# Patient Record
Sex: Male | Born: 1970 | Race: Black or African American | Hispanic: No | Marital: Single | Smoking: Light tobacco smoker
Health system: Southern US, Community
[De-identification: ages and names within clinical notes are randomized; demographics above are authoritative.]

## PROBLEM LIST (undated history)

## (undated) DIAGNOSIS — I10 Essential (primary) hypertension: Secondary | ICD-10-CM

## (undated) HISTORY — DX: Essential (primary) hypertension: I10

---

## 2002-10-04 ENCOUNTER — Emergency Department (HOSPITAL_COMMUNITY): Admission: EM | Admit: 2002-10-04 | Discharge: 2002-10-04 | Payer: Self-pay | Admitting: *Deleted

## 2002-11-10 ENCOUNTER — Encounter: Payer: Self-pay | Admitting: Emergency Medicine

## 2002-11-10 ENCOUNTER — Emergency Department (HOSPITAL_COMMUNITY): Admission: EM | Admit: 2002-11-10 | Discharge: 2002-11-10 | Payer: Self-pay | Admitting: Emergency Medicine

## 2006-07-20 ENCOUNTER — Emergency Department (HOSPITAL_COMMUNITY): Admission: EM | Admit: 2006-07-20 | Discharge: 2006-07-20 | Payer: Self-pay | Admitting: Emergency Medicine

## 2007-06-30 ENCOUNTER — Emergency Department (HOSPITAL_COMMUNITY): Admission: EM | Admit: 2007-06-30 | Discharge: 2007-06-30 | Payer: Self-pay | Admitting: Emergency Medicine

## 2007-07-19 ENCOUNTER — Emergency Department (HOSPITAL_COMMUNITY): Admission: EM | Admit: 2007-07-19 | Discharge: 2007-07-19 | Payer: Self-pay | Admitting: Family Medicine

## 2011-03-06 ENCOUNTER — Emergency Department (HOSPITAL_COMMUNITY)
Admission: EM | Admit: 2011-03-06 | Discharge: 2011-03-06 | Disposition: A | Discharge status: Home / Self Care | Payer: No Typology Code available for payment source | Attending: Emergency Medicine | Admitting: Emergency Medicine

## 2011-03-06 DIAGNOSIS — M545 Low back pain, unspecified: Secondary | ICD-10-CM | POA: Insufficient documentation

## 2011-03-06 DIAGNOSIS — S335XXA Sprain of ligaments of lumbar spine, initial encounter: Secondary | ICD-10-CM | POA: Insufficient documentation

## 2011-03-06 DIAGNOSIS — M25519 Pain in unspecified shoulder: Secondary | ICD-10-CM | POA: Insufficient documentation

## 2020-06-26 ENCOUNTER — Encounter (HOSPITAL_COMMUNITY): Payer: Self-pay | Admitting: Emergency Medicine

## 2020-06-26 ENCOUNTER — Inpatient Hospital Stay (HOSPITAL_COMMUNITY)
Admission: EM | Admit: 2020-06-26 | Discharge: 2020-06-30 | DRG: 871 | Disposition: A | Discharge status: Home / Self Care | Payer: 59 | Attending: Internal Medicine | Admitting: Internal Medicine

## 2020-06-26 ENCOUNTER — Emergency Department (HOSPITAL_COMMUNITY): Payer: 59

## 2020-06-26 DIAGNOSIS — G8929 Other chronic pain: Secondary | ICD-10-CM | POA: Diagnosis present

## 2020-06-26 DIAGNOSIS — E785 Hyperlipidemia, unspecified: Secondary | ICD-10-CM | POA: Diagnosis present

## 2020-06-26 DIAGNOSIS — R9431 Abnormal electrocardiogram [ECG] [EKG]: Secondary | ICD-10-CM | POA: Diagnosis not present

## 2020-06-26 DIAGNOSIS — R609 Edema, unspecified: Secondary | ICD-10-CM | POA: Diagnosis not present

## 2020-06-26 DIAGNOSIS — F17200 Nicotine dependence, unspecified, uncomplicated: Secondary | ICD-10-CM | POA: Diagnosis present

## 2020-06-26 DIAGNOSIS — J90 Pleural effusion, not elsewhere classified: Secondary | ICD-10-CM | POA: Diagnosis not present

## 2020-06-26 DIAGNOSIS — I452 Bifascicular block: Secondary | ICD-10-CM | POA: Diagnosis present

## 2020-06-26 DIAGNOSIS — I10 Essential (primary) hypertension: Secondary | ICD-10-CM | POA: Diagnosis present

## 2020-06-26 DIAGNOSIS — Z6832 Body mass index (BMI) 32.0-32.9, adult: Secondary | ICD-10-CM | POA: Diagnosis not present

## 2020-06-26 DIAGNOSIS — A419 Sepsis, unspecified organism: Secondary | ICD-10-CM | POA: Diagnosis present

## 2020-06-26 DIAGNOSIS — K429 Umbilical hernia without obstruction or gangrene: Secondary | ICD-10-CM | POA: Diagnosis present

## 2020-06-26 DIAGNOSIS — R1031 Right lower quadrant pain: Secondary | ICD-10-CM

## 2020-06-26 DIAGNOSIS — R071 Chest pain on breathing: Secondary | ICD-10-CM | POA: Diagnosis not present

## 2020-06-26 DIAGNOSIS — Z720 Tobacco use: Secondary | ICD-10-CM | POA: Diagnosis not present

## 2020-06-26 DIAGNOSIS — R0902 Hypoxemia: Secondary | ICD-10-CM | POA: Diagnosis not present

## 2020-06-26 DIAGNOSIS — Z8616 Personal history of COVID-19: Secondary | ICD-10-CM

## 2020-06-26 DIAGNOSIS — F1410 Cocaine abuse, uncomplicated: Secondary | ICD-10-CM | POA: Diagnosis present

## 2020-06-26 DIAGNOSIS — K5900 Constipation, unspecified: Secondary | ICD-10-CM | POA: Diagnosis present

## 2020-06-26 DIAGNOSIS — J189 Pneumonia, unspecified organism: Secondary | ICD-10-CM | POA: Diagnosis present

## 2020-06-26 DIAGNOSIS — R6520 Severe sepsis without septic shock: Secondary | ICD-10-CM | POA: Diagnosis present

## 2020-06-26 DIAGNOSIS — R109 Unspecified abdominal pain: Secondary | ICD-10-CM | POA: Diagnosis not present

## 2020-06-26 DIAGNOSIS — I351 Nonrheumatic aortic (valve) insufficiency: Secondary | ICD-10-CM | POA: Diagnosis not present

## 2020-06-26 DIAGNOSIS — E669 Obesity, unspecified: Secondary | ICD-10-CM | POA: Diagnosis present

## 2020-06-26 DIAGNOSIS — R079 Chest pain, unspecified: Secondary | ICD-10-CM | POA: Diagnosis present

## 2020-06-26 DIAGNOSIS — Z20822 Contact with and (suspected) exposure to covid-19: Secondary | ICD-10-CM | POA: Diagnosis present

## 2020-06-26 DIAGNOSIS — D649 Anemia, unspecified: Secondary | ICD-10-CM | POA: Diagnosis present

## 2020-06-26 DIAGNOSIS — J9601 Acute respiratory failure with hypoxia: Secondary | ICD-10-CM | POA: Diagnosis present

## 2020-06-26 LAB — CBC WITH DIFFERENTIAL/PLATELET
Abs Immature Granulocytes: 0.06 10*3/uL (ref 0.00–0.07)
Basophils Absolute: 0.1 10*3/uL (ref 0.0–0.1)
Basophils Relative: 0 %
Eosinophils Absolute: 0 10*3/uL (ref 0.0–0.5)
Eosinophils Relative: 0 %
HCT: 42.6 % (ref 39.0–52.0)
Hemoglobin: 13.3 g/dL (ref 13.0–17.0)
Immature Granulocytes: 0 %
Lymphocytes Relative: 11 %
Lymphs Abs: 1.8 10*3/uL (ref 0.7–4.0)
MCH: 26.9 pg (ref 26.0–34.0)
MCHC: 31.2 g/dL (ref 30.0–36.0)
MCV: 86.2 fL (ref 80.0–100.0)
Monocytes Absolute: 0.9 10*3/uL (ref 0.1–1.0)
Monocytes Relative: 6 %
Neutro Abs: 13.2 10*3/uL — ABNORMAL HIGH (ref 1.7–7.7)
Neutrophils Relative %: 83 %
Platelets: 298 10*3/uL (ref 150–400)
RBC: 4.94 MIL/uL (ref 4.22–5.81)
RDW: 14.6 % (ref 11.5–15.5)
WBC: 16 10*3/uL — ABNORMAL HIGH (ref 4.0–10.5)
nRBC: 0 % (ref 0.0–0.2)

## 2020-06-26 LAB — CBC
HCT: 39.7 % (ref 39.0–52.0)
Hemoglobin: 13 g/dL (ref 13.0–17.0)
MCH: 28 pg (ref 26.0–34.0)
MCHC: 32.7 g/dL (ref 30.0–36.0)
MCV: 85.4 fL (ref 80.0–100.0)
Platelets: 295 10*3/uL (ref 150–400)
RBC: 4.65 MIL/uL (ref 4.22–5.81)
RDW: 14.6 % (ref 11.5–15.5)
WBC: 13.2 10*3/uL — ABNORMAL HIGH (ref 4.0–10.5)
nRBC: 0 % (ref 0.0–0.2)

## 2020-06-26 LAB — LACTIC ACID, PLASMA
Lactic Acid, Venous: 1.9 mmol/L (ref 0.5–1.9)
Lactic Acid, Venous: 1 mmol/L (ref 0.5–1.9)
Lactic Acid, Venous: 1 mmol/L (ref 0.5–1.9)

## 2020-06-26 LAB — COMPREHENSIVE METABOLIC PANEL
ALT: 58 U/L — ABNORMAL HIGH (ref 0–44)
AST: 20 U/L (ref 15–41)
Albumin: 3.5 g/dL (ref 3.5–5.0)
Alkaline Phosphatase: 91 U/L (ref 38–126)
Anion gap: 9 (ref 5–15)
BUN: 10 mg/dL (ref 6–20)
CO2: 25 mmol/L (ref 22–32)
Calcium: 9.2 mg/dL (ref 8.9–10.3)
Chloride: 103 mmol/L (ref 98–111)
Creatinine, Ser: 1.2 mg/dL (ref 0.61–1.24)
GFR, Estimated: >60 mL/min (ref >60)
Glucose, Bld: 123 mg/dL — ABNORMAL HIGH (ref 70–99)
Potassium: 3.8 mmol/L (ref 3.5–5.1)
Sodium: 137 mmol/L (ref 135–145)
Total Bilirubin: 1.4 mg/dL — ABNORMAL HIGH (ref 0.3–1.2)
Total Protein: 7.2 g/dL (ref 6.5–8.1)

## 2020-06-26 LAB — URINALYSIS, ROUTINE W REFLEX MICROSCOPIC
Bacteria, UA: NONE SEEN
Bilirubin Urine: NEGATIVE
Glucose, UA: NEGATIVE mg/dL
Ketones, ur: NEGATIVE mg/dL
Leukocytes,Ua: NEGATIVE
Nitrite: NEGATIVE
Protein, ur: NEGATIVE mg/dL
Specific Gravity, Urine: >1.046 — ABNORMAL HIGH (ref 1.005–1.030)
pH: 5 (ref 5.0–8.0)

## 2020-06-26 LAB — LACTATE DEHYDROGENASE: LDH: 137 U/L (ref 98–192)

## 2020-06-26 LAB — LIPASE, BLOOD: Lipase: 26 U/L (ref 11–51)

## 2020-06-26 LAB — C-REACTIVE PROTEIN: CRP: 23.1 mg/dL — ABNORMAL HIGH (ref <1.0)

## 2020-06-26 LAB — TROPONIN I (HIGH SENSITIVITY): Troponin I (High Sensitivity): 12 ng/L (ref <18)

## 2020-06-26 LAB — TRIGLYCERIDES: Triglycerides: 41 mg/dL (ref <150)

## 2020-06-26 LAB — FERRITIN: Ferritin: 292 ng/mL (ref 24–336)

## 2020-06-26 LAB — PROCALCITONIN: Procalcitonin: 0.15 ng/mL

## 2020-06-26 LAB — D-DIMER, QUANTITATIVE: D-Dimer, Quant: 1.5 ug/mL-FEU — ABNORMAL HIGH (ref 0.00–0.50)

## 2020-06-26 LAB — FIBRINOGEN: Fibrinogen: 775 mg/dL — ABNORMAL HIGH (ref 210–475)

## 2020-06-26 MED ORDER — MORPHINE SULFATE (PF) 2 MG/ML IV SOLN
2.0000 mg | Freq: Once | INTRAVENOUS | Status: AC
  Administered 2020-06-26: 2 mg via INTRAVENOUS
  Filled 2020-06-26: qty 1

## 2020-06-26 MED ORDER — SODIUM CHLORIDE 0.9 % IV BOLUS
1000.0000 mL | Freq: Once | INTRAVENOUS | Status: AC
  Administered 2020-06-26: 1000 mL via INTRAVENOUS

## 2020-06-26 MED ORDER — SODIUM CHLORIDE 0.9 % IV SOLN
1.0000 g | INTRAVENOUS | Status: DC
  Administered 2020-06-27 – 2020-06-29 (×3): 1 g via INTRAVENOUS
  Filled 2020-06-26 (×2): qty 10
  Filled 2020-06-26 (×2): qty 1

## 2020-06-26 MED ORDER — HYDROMORPHONE HCL 1 MG/ML IJ SOLN
1.0000 mg | Freq: Once | INTRAMUSCULAR | Status: AC
  Administered 2020-06-26: 1 mg via INTRAVENOUS
  Filled 2020-06-26: qty 1

## 2020-06-26 MED ORDER — ALBUTEROL SULFATE (2.5 MG/3ML) 0.083% IN NEBU: 2.5000 mg | INHALATION_SOLUTION | RESPIRATORY_TRACT | Status: DC | PRN

## 2020-06-26 MED ORDER — SODIUM CHLORIDE 0.9 % IV SOLN
1.0000 g | Freq: Once | INTRAVENOUS | Status: AC
  Administered 2020-06-26: 1 g via INTRAVENOUS
  Filled 2020-06-26: qty 10

## 2020-06-26 MED ORDER — POLYETHYLENE GLYCOL 3350 17 G PO PACK
17.0000 g | PACK | Freq: Every day | ORAL | Status: DC | PRN
  Administered 2020-06-27 – 2020-06-29 (×2): 17 g via ORAL
  Filled 2020-06-26 (×2): qty 1

## 2020-06-26 MED ORDER — SODIUM CHLORIDE 0.9 % IV SOLN
75.0000 mL/h | INTRAVENOUS | Status: AC
  Administered 2020-06-26: 75 mL/h via INTRAVENOUS

## 2020-06-26 MED ORDER — DOCUSATE SODIUM 100 MG PO CAPS
100.0000 mg | ORAL_CAPSULE | Freq: Two times a day (BID) | ORAL | Status: DC
  Administered 2020-06-26 – 2020-06-30 (×8): 100 mg via ORAL
  Filled 2020-06-26 (×8): qty 1

## 2020-06-26 MED ORDER — IOHEXOL 300 MG/ML  SOLN
100.0000 mL | Freq: Once | INTRAMUSCULAR | Status: AC | PRN
  Administered 2020-06-26: 100 mL via INTRAVENOUS

## 2020-06-26 MED ORDER — SODIUM CHLORIDE 0.9 % IV SOLN
500.0000 mg | Freq: Once | INTRAVENOUS | Status: AC
  Administered 2020-06-26: 500 mg via INTRAVENOUS
  Filled 2020-06-26: qty 500

## 2020-06-26 MED ORDER — SODIUM CHLORIDE 0.9 % IV SOLN: INTRAVENOUS | Status: DC

## 2020-06-26 MED ORDER — HYDRALAZINE HCL 20 MG/ML IJ SOLN
10.0000 mg | INTRAMUSCULAR | Status: DC | PRN
  Administered 2020-06-27 (×2): 10 mg via INTRAVENOUS
  Filled 2020-06-26 (×2): qty 1

## 2020-06-26 MED ORDER — SENNA 8.6 MG PO TABS
1.0000 | ORAL_TABLET | Freq: Two times a day (BID) | ORAL | Status: DC
  Administered 2020-06-26 – 2020-06-30 (×8): 8.6 mg via ORAL
  Filled 2020-06-26 (×8): qty 1

## 2020-06-26 MED ORDER — HYDROMORPHONE HCL 1 MG/ML IJ SOLN: 0.5000 mg | INTRAMUSCULAR | Status: DC | PRN

## 2020-06-26 MED ORDER — SODIUM CHLORIDE 0.9 % IV SOLN
500.0000 mg | INTRAVENOUS | Status: DC
  Administered 2020-06-27 – 2020-06-29 (×3): 500 mg via INTRAVENOUS
  Filled 2020-06-26 (×4): qty 500

## 2020-06-26 MED ORDER — ACETAMINOPHEN 650 MG RE SUPP: 650.0000 mg | Freq: Four times a day (QID) | RECTAL | Status: DC | PRN

## 2020-06-26 MED ORDER — ONDANSETRON HCL 4 MG/2ML IJ SOLN
4.0000 mg | Freq: Once | INTRAMUSCULAR | Status: AC
  Administered 2020-06-26: 4 mg via INTRAVENOUS
  Filled 2020-06-26: qty 2

## 2020-06-26 MED ORDER — ONDANSETRON HCL 4 MG PO TABS: 4.0000 mg | ORAL_TABLET | Freq: Four times a day (QID) | ORAL | Status: DC | PRN

## 2020-06-26 MED ORDER — ONDANSETRON HCL 4 MG/2ML IJ SOLN: 4.0000 mg | Freq: Four times a day (QID) | INTRAMUSCULAR | Status: DC | PRN

## 2020-06-26 MED ORDER — ACETAMINOPHEN 325 MG PO TABS
650.0000 mg | ORAL_TABLET | Freq: Four times a day (QID) | ORAL | Status: DC | PRN
  Filled 2020-06-26: qty 2

## 2020-06-26 MED ORDER — HYDROCODONE-ACETAMINOPHEN 5-325 MG PO TABS
1.0000 | ORAL_TABLET | ORAL | Status: DC | PRN
  Administered 2020-06-27 (×3): 2 via ORAL
  Administered 2020-06-27: 1 via ORAL
  Administered 2020-06-28 – 2020-06-30 (×9): 2 via ORAL
  Filled 2020-06-26 (×12): qty 2
  Filled 2020-06-26: qty 1
  Filled 2020-06-26: qty 2

## 2020-06-26 MED ORDER — ACETAMINOPHEN 325 MG PO TABS
650.0000 mg | ORAL_TABLET | Freq: Once | ORAL | Status: AC
  Administered 2020-06-26: 650 mg via ORAL
  Filled 2020-06-26: qty 2

## 2020-06-26 MED ORDER — BISACODYL 10 MG RE SUPP: 10.0000 mg | Freq: Every day | RECTAL | Status: DC | PRN

## 2020-06-26 NOTE — ED Triage Notes (Signed)
Pt arrives to ED with chief complaint of lower abd pain and bloating that started yesterday has been intense pain denies any n/v/d and is able to have normal BM.

## 2020-06-26 NOTE — ED Notes (Signed)
Pt back from Ct

## 2020-06-26 NOTE — ED Notes (Signed)
Pt refusing ct again for 3rd time, continues to have 10/10 pain after 2mg  of dilaudid provider notified.

## 2020-06-26 NOTE — ED Notes (Signed)
Patient transported to X-ray 

## 2020-06-26 NOTE — ED Notes (Signed)
Patient transported to CT 

## 2020-06-26 NOTE — ED Notes (Signed)
Pt aao4, [id number], reporting abd pain and unable to lay flat, pt taken to ct and pt continues to refuse to lay flat, ccm in place, vss on monitor. fluids infusing w/o difficulty.

## 2020-06-26 NOTE — ED Provider Notes (Signed)
[facility] EMERGENCY DEPARTMENT Provider Note   CSN: [id number] Arrival date & time: 06/26/20  1141     History Chief Complaint  Patient presents with  . Abdominal Pain    Gary Skinner is a 50 y.o. male.  Patient came in by POV.  Patient with a complaint of right lower quadrant abdominal pain that started at 2:00 yesterday.  No nausea vomiting or diarrhea.  No known allergies.  Has not had pain like this before.  Patient was afebrile on presentation but then developed a temp as high as 103.  Patient has had both Covid vaccines.  Has not had the booster.  His wife had Covid a month ago.  Patient his wife states that he has had a cough for a few days.  Did not note a fever at home.        History reviewed. No pertinent past medical history.  Patient Active Problem List   Diagnosis Date Noted  . CAP (community acquired pneumonia) 06/26/2020    History reviewed. No pertinent surgical history.     No family history on file.     Home Medications Prior to Admission medications   Not on File    Allergies    Patient has no known allergies.  Review of Systems   Review of Systems  Constitutional: Positive for fever. Negative for chills.  HENT: Negative for rhinorrhea and sore throat.   Eyes: Negative for visual disturbance.  Respiratory: Positive for cough. Negative for shortness of breath.   Cardiovascular: Negative for chest pain and leg swelling.  Gastrointestinal: Positive for abdominal pain. Negative for diarrhea, nausea and vomiting.  Genitourinary: Negative for dysuria.  Musculoskeletal: Negative for back pain and neck pain.  Skin: Negative for rash.  Neurological: Negative for dizziness, light-headedness and headaches.  Hematological: Does not bruise/bleed easily.  Psychiatric/Behavioral: Negative for confusion.    Physical Exam Updated Vital Signs BP (!) 146/85   Pulse 91   Temp (!) 101.5 F (38.6 C)   Resp (!) 29   SpO2 91%    Physical Exam Vitals and nursing note reviewed.  Constitutional:      General: He is not in acute distress.    Appearance: He is well-developed and well-nourished. He is ill-appearing.  HENT:     Head: Normocephalic and atraumatic.  Eyes:     Conjunctiva/sclera: Conjunctivae normal.     Pupils: Pupils are equal, round, and reactive to light.  Cardiovascular:     Rate and Rhythm: Normal rate and regular rhythm.     Heart sounds: No murmur heard.   Pulmonary:     Effort: Pulmonary effort is normal. No respiratory distress.     Breath sounds: Normal breath sounds.  Chest:     Chest wall: No tenderness.  Abdominal:     General: There is no distension.     Palpations: Abdomen is soft.     Tenderness: There is no abdominal tenderness. There is no guarding.  Musculoskeletal:        General: No swelling or edema. Normal range of motion.     Cervical back: Neck supple.  Skin:    General: Skin is warm and dry.     Capillary Refill: Capillary refill takes less than 2 seconds.  Neurological:     General: No focal deficit present.     Mental Status: He is alert and oriented to person, place, and time.     Cranial Nerves: No cranial nerve deficit.  Sensory: No sensory deficit.     Motor: No weakness.  Psychiatric:        Mood and Affect: Mood and affect normal.     ED Results / Procedures / Treatments   Labs (all labs ordered are listed, but only abnormal results are displayed) Labs Reviewed  COMPREHENSIVE METABOLIC PANEL - Abnormal; Notable for the following components:      Result Value   Glucose, Bld 123 (*)    ALT 58 (*)    Total Bilirubin 1.4 (*)    All other components within normal limits  CBC - Abnormal; Notable for the following components:   WBC 13.2 (*)    All other components within normal limits  CBC WITH DIFFERENTIAL/PLATELET - Abnormal; Notable for the following components:   WBC 16.0 (*)    Neutro Abs 13.2 (*)    All other components within normal  limits  CULTURE, BLOOD (ROUTINE X 2)  EXPECTORATED SPUTUM ASSESSMENT W GRAM STAIN, RFLX TO RESP C  LIPASE, BLOOD  LACTIC ACID, PLASMA  URINALYSIS, ROUTINE W REFLEX MICROSCOPIC  LACTIC ACID, PLASMA  LACTIC ACID, PLASMA  D-DIMER, QUANTITATIVE  PROCALCITONIN  LACTATE DEHYDROGENASE  FERRITIN  TRIGLYCERIDES  FIBRINOGEN  C-REACTIVE PROTEIN  RAPID URINE DRUG SCREEN, HOSP PERFORMED  STREP PNEUMONIAE URINARY ANTIGEN  PROCALCITONIN  LACTIC ACID, PLASMA  LACTIC ACID, PLASMA  POC SARS CORONAVIRUS 2 AG -  ED  TROPONIN I (HIGH SENSITIVITY)    EKG EKG Interpretation  Date/Time:  Wednesday June 26 2020 15:57:34 EST Ventricular Rate:  111 PR Interval:    QRS Duration: 103 QT Interval:  319 QTC Calculation: 434 R Axis:   -34 Text Interpretation: Sinus tachycardia Probable left atrial enlargement Incomplete RBBB and LAFB Left ventricular hypertrophy Anterior Q waves, possibly due to LVH No previous ECGs available Confirmed by Vanetta Mulders [phone]) on 06/26/2020 4:04:34 PM   Radiology DG Chest 2 View  Result Date: 06/26/2020 CLINICAL DATA:  Shortness of breath and abdominal pain. EXAM: CHEST - 2 VIEW COMPARISON:  None. FINDINGS: 2022 hours. Low volumes. The cardio pericardial silhouette is enlarged. Right base collapse/consolidation with small right pleural effusion. Retrocardiac collapse/consolidative opacity noted. The visualized bony structures of the thorax show no acute abnormality. Telemetry leads overlie the chest. IMPRESSION: Low volume film with bibasilar collapse/consolidation and small right pleural effusion. Electronically Signed   By: Kennith Center M.D.   On: 06/26/2020 20:31   CT Abdomen Pelvis W Contrast  Result Date: 06/26/2020 CLINICAL DATA:  RLQ abdominal pain, appendicitis suspected (Age >= 14y) Lower abdominal pain. EXAM: CT ABDOMEN AND PELVIS WITH CONTRAST TECHNIQUE: Multidetector CT imaging of the abdomen and pelvis was performed using the standard protocol  following bolus administration of intravenous contrast. CONTRAST:  OMNIPAQUE IOHEXOL 300 MG/ML  SOLN COMPARISON:  None. FINDINGS: Lower chest: Small right pleural effusion that is partially loculated, with fluid tracking into the minor fissure. Heterogeneous right basilar consolidation including at 2.6 cm rounded low-density component dependently, series 3, image 14. Basilar emphysema suspected, breathing motion limits detailed assessment. Subsegmental atelectasis in the left lower lobe. Heart is upper normal in size. Hepatobiliary: No focal hepatic abnormality or focal lesion. Gallbladder is decompressed. There is a Phrygian cap. No calcified gallstone or pericholecystic fat stranding. No biliary dilatation. Pancreas: No ductal dilatation or inflammation. No evidence of pancreatic mass. Spleen: Normal in size without focal abnormality. Adrenals/Urinary Tract: Diffuse left adrenal thickening without dominant nodule. Normal right adrenal gland. No hydronephrosis or perinephric edema. There are parapelvic cysts  in the upper left kidney. Tiny right kidney cortical hypodensity medially is too small to characterize. A least 4 nonobstructing stones in the mid and lower left kidney. Homogeneous renal enhancement with symmetric excretion on delayed phase imaging. Urinary bladder is completely empty and not well assessed. Stomach/Bowel: Unremarkable stomach. Normal positioning of the duodenum and ligament of Treitz. Fluid within nondilated small bowel in the left abdomen without wall thickening or inflammation. No evidence of obstruction. Normal appendix, for example series 3, image 69. Moderate stool in the ascending colon. Air-filled transverse colon. Descending colon is decompressed. Sigmoid colon is redundant. No colonic wall thickening, inflammation, or evidence of mass. No significant diverticular disease. Vascular/Lymphatic: Aortic atherosclerosis. No aortic aneurysm. Circumaortic left renal vein. There is a 12  mm portal caval nodes that is nonspecific, series 3, image 35. no pelvic adenopathy. Reproductive: Prostate is unremarkable. Other: No free air, free fluid, or intra-abdominal fluid collection. Small fat containing umbilical hernia. Musculoskeletal: There are no acute or suspicious osseous abnormalities. No focal bone lesion. IMPRESSION: 1. Normal appendix. 2. Small right pleural effusion that is partially loculated. Heterogeneous right basilar consolidation including at 2.6 cm rounded low-density component dependently. Differential considerations include pneumonia or neoplasm with surrounding atelectasis. Recommend close clinical follow-up and short interval follow-up CT after a course of treatment. Chest radiograph could be obtained for baseline. 3. Nonobstructing left renal stones. Parapelvic cysts in the upper left kidney. 4. Nonspecific 12 mm portal caval lymph node, likely reactive. 5. Possible emphysema in the lung bases, obscured by breathing motion artifact. Aortic Atherosclerosis (ICD10-I70.0) . Electronically Signed   By: Narda Rutherford M.D.   On: 06/26/2020 19:52    Procedures Procedures  CRITICAL CARE Performed by: Vanetta Mulders Total critical care time: 35 minutes Critical care time was exclusive of separately billable procedures and treating other patients. Critical care was necessary to treat or prevent imminent or life-threatening deterioration. Critical care was time spent personally by me on the following activities: development of treatment plan with patient and/or surrogate as well as nursing, discussions with consultants, evaluation of patient's response to treatment, examination of patient, obtaining history from patient or surrogate, ordering and performing treatments and interventions, ordering and review of laboratory studies, ordering and review of radiographic studies, pulse oximetry and re-evaluation of patient's condition.   Medications Ordered in ED Medications  0.9  %  sodium chloride infusion ( Intravenous New Bag/Given 06/26/20 1750)  azithromycin (ZITHROMAX) 500 mg in sodium chloride 0.9 % 250 mL IVPB (has no administration in time range)  0.9 %  sodium chloride infusion ( Intravenous New Bag/Given 06/26/20 2113)  sodium chloride 0.9 % bolus 1,000 mL (0 mLs Intravenous Stopped 06/26/20 1751)  ondansetron (ZOFRAN) injection 4 mg (4 mg Intravenous Given 06/26/20 1548)  HYDROmorphone (DILAUDID) injection 1 mg (1 mg Intravenous Given 06/26/20 1548)  HYDROmorphone (DILAUDID) injection 1 mg (1 mg Intravenous Given 06/26/20 1651)  HYDROmorphone (DILAUDID) injection 1 mg (1 mg Intravenous Given 06/26/20 1748)  morphine 2 MG/ML injection 2 mg (2 mg Intravenous Given 06/26/20 1836)  acetaminophen (TYLENOL) tablet 650 mg (650 mg Oral Given 06/26/20 1840)  sodium chloride 0.9 % bolus 1,000 mL (1,000 mLs Intravenous New Bag/Given 06/26/20 1835)  morphine 2 MG/ML injection 2 mg (2 mg Intravenous Given 06/26/20 1920)  iohexol (OMNIPAQUE) 300 MG/ML solution 100 mL (100 mLs Intravenous Contrast Given 06/26/20 1939)  cefTRIAXone (ROCEPHIN) 1 g in sodium chloride 0.9 % 100 mL IVPB (1 g Intravenous New Bag/Given 06/26/20 2112)    ED  Course  I have reviewed the triage vital signs and the nursing notes.  Pertinent labs & imaging results that were available during my care of the patient were reviewed by me and considered in my medical decision making (see chart for details).    MDM Rules/Calculators/A&P                          Patient with an extensive work-up here in the emergency department.  Based on his presentation with the right lower quadrant abdominal pain and fever was thinking in terms of may be appendicitis or diverticulitis.  But CT scan of the abdomen negative for any acute abdominal process.  Did raise some concerns about right pleural effusion and perhaps a right consolidated opacity.  So chest x-ray was done.  Showed some more of that.  Then patient did develop a need  for some oxygen.  But he did require a lot of pain medicine so that he could lay flat to do the CT scan of the abdomen.  So his hypoxia could be related to that.  But oxygen sats on room air will go down to 86%.  Now on 2 L patient is more alert and the his oxygen is 94%.  Patient started on antibiotics for possible pneumonia.  To include Rocephin and azithromycin.  Initial white blood cell count was 13,000.  Repeat was 16,000.  Covid testing is pending.  Urinalysis is pending.  Initial lactic acid was 1.9.  Repeat is pending.  Hard to thinks in terms that patient may be has Covid infection.  So Covid antigen has been sent to the lab but we do not have results yet.  If that is negative will need PCR test.  EKG had some strange findings with ST segment elevation in V1 and V3.  And then also showing an incomplete right bundle branch block and left anterior fascicular block.  Patient denies any chest pain.  May be a little bit of shortness of breath.  We will repeat the EKG.  Hospitalist will see the patient for admission.  Clinically I feel there is an infectious process somewhere.  With the leukocytosis and the fevers.  Could be lung could be urine.  Is also possible this could be Covid infection.  Patient also had Covid parameter labs done.  Final Clinical Impression(s) / ED Diagnoses Final diagnoses:  Hypoxia  Right lower quadrant abdominal pain  Pleural effusion on right  Community acquired pneumonia of right lower lobe of lung    Rx / DC Orders ED Discharge Orders    None       Vanetta MuldersZackowski, Cyd Hostler, MD 06/26/20 2158

## 2020-06-26 NOTE — ED Notes (Signed)
Pt spo2 drops after dilaudid admin to 90% on room air, pt denies any sob, 2 liters [place] provided but pt refusing to wear [place] stating its too much on his face.

## 2020-06-26 NOTE — H&P (Signed)
Gary Skinner VWP:[id number] DOB: Feb 03, 1971 DOA: 06/26/2020   PCP: Merryl Hacker, No   Outpatient Specialists:   NONE    Patient arrived to ER on 06/26/20 at 1141 Referred by Attending Fredia Sorrow, MD  Patient coming from: home Lives  With family    Chief Complaint:   Chief Complaint  Patient presents with  . Abdominal Pain    HPI: Gary Skinner is a 50 y.o. male with medical history significant of elevated BP    Presented with lower abdominal pain bloating since yesterday continued to have normal BM, He was at work when it started. Pain is coming and going worse on the light lower abdomen.  Reports 2 days of mild dry cough, no sore throat, no change in appetite SO reported she had COVID about 1 month ago January 10th He did not developed any symptoms back in January He takes Ibuprofen and naproxen for chronic pain He smokes occasionally and drinks He have been told he has high blood pressure but not taking anything   CP on the right side of his chest with deep breath or cough  Infectious risk factors:  Reports fever, cough  Has been vaccinated against COVID not boosted   Initial COVID TEST   in house  PCR testing  Pending  No results found for: SARSCOV2NAA     While in ER: Reported significant abdominal pain unable to lay down flat given IV Dilaudid after which his O2 sats dropped to 90s on room air 2 L was provided but patient refused to wear them. Initially hypertensive 182/95 tachycardic up to 107 febrile up to 101.9 Noted to have elevated white blood cell count 13.2 meeting SIRS parameters  CT abdomen done incidentally showing heterogeneous right basilar consolidation differential including pneumonia versus neoplasm No abnormalities in the abdomen to explain pain  Chest x-ray was showing bilateral collapse versus consolidation and small right pleural effusion   Hospitalist was called for admission for Pneumonia, acute respiratory failure with hypoxia  The  following Work up has been ordered so far:  Orders Placed This Encounter  Procedures  . Culture, blood (Routine X 2) w Reflex to ID Panel  . CT Abdomen Pelvis W Contrast  . DG Chest 2 View  . Lipase, blood  . Comprehensive metabolic panel  . CBC  . Urinalysis, Routine w reflex microscopic  . Lactic acid, plasma  . Lactic acid, plasma  . CBC WITH DIFFERENTIAL  . D-dimer, quantitative  . Procalcitonin  . Lactate dehydrogenase  . Ferritin  . Triglycerides  . Fibrinogen  . C-reactive protein  . Diet NPO time specified  . Cardiac monitoring  . Initiate Carrier Fluid Protocol  . Place surgical mask on patient  . Patient to wear surgical mask during transportation  . Assess patient for ability to self-prone. If able (can move self in bed, ambulate) and stable (SpO2 and oxygen requirement):  . RN/NT - Document specific oxygen requirements in CHL  . Notify EDP if new oxygen requirements escalates > 4L per minute [place]  . RN to draw the following extra tubes:  . Consult for [place] Admission  ALL PATIENTS BEING ADMITTED/HAVING PROCEDURES NEED COVID-19 SCREENING  . Airborne and Contact precautions  . Oxygen therapy Mode or (Route): Nasal cannula; Liters Per Minute: 2  . Pulse oximetry, continuous  . POC SARS Coronavirus 2 Ag-ED - Nasal Swab  . ED EKG  . EKG 12-Lead   Following Medications were ordered in ER: Medications  0.9 %  sodium chloride infusion ( Intravenous New Bag/Given 06/26/20 1750)  cefTRIAXone (ROCEPHIN) 1 g in sodium chloride 0.9 % 100 mL IVPB (1 g Intravenous New Bag/Given 06/26/20 2112)  azithromycin (ZITHROMAX) 500 mg in sodium chloride 0.9 % 250 mL IVPB (has no administration in time range)  0.9 %  sodium chloride infusion ( Intravenous New Bag/Given 06/26/20 2113)  sodium chloride 0.9 % bolus 1,000 mL (0 mLs Intravenous Stopped 06/26/20 1751)  ondansetron (ZOFRAN) injection 4 mg (4 mg Intravenous Given 06/26/20 1548)  HYDROmorphone (DILAUDID) injection 1 mg  (1 mg Intravenous Given 06/26/20 1548)  HYDROmorphone (DILAUDID) injection 1 mg (1 mg Intravenous Given 06/26/20 1651)  HYDROmorphone (DILAUDID) injection 1 mg (1 mg Intravenous Given 06/26/20 1748)  morphine 2 MG/ML injection 2 mg (2 mg Intravenous Given 06/26/20 1836)  acetaminophen (TYLENOL) tablet 650 mg (650 mg Oral Given 06/26/20 1840)  sodium chloride 0.9 % bolus 1,000 mL (1,000 mLs Intravenous New Bag/Given 06/26/20 1835)  morphine 2 MG/ML injection 2 mg (2 mg Intravenous Given 06/26/20 1920)  iohexol (OMNIPAQUE) 300 MG/ML solution 100 mL (100 mLs Intravenous Contrast Given 06/26/20 1939)    Significant initial  Findings: Abnormal Labs Reviewed  COMPREHENSIVE METABOLIC PANEL - Abnormal; Notable for the following components:      Result Value   Glucose, Bld 123 (*)    ALT 58 (*)    Total Bilirubin 1.4 (*)    All other components within normal limits  CBC - Abnormal; Notable for the following components:   WBC 13.2 (*)    All other components within normal limits   Otherwise labs showing:    Recent Labs  Lab 06/26/20 1212  NA 137  K 3.8  CO2 25  GLUCOSE 123*  BUN 10  CREATININE 1.20  CALCIUM 9.2    Cr   stable,    Lab Results  Component Value Date   CREATININE 1.20 06/26/2020    Recent Labs  Lab 06/26/20 1212  AST 20  ALT 58*  ALKPHOS 91  BILITOT 1.4*  PROT 7.2  ALBUMIN 3.5   Lab Results  Component Value Date   CALCIUM 9.2 06/26/2020      WBC      Component Value Date/Time   WBC 13.2 (H) 06/26/2020 1212    Plt: Lab Results  Component Value Date   PLT 295 06/26/2020     Lactic Acid, Venous    Component Value Date/Time   LATICACIDVEN 1.9 06/26/2020 1644    Procalcitonin 0.15   COVID-19 Labs  Recent Labs    06/26/20 2100  DDIMER 1.50*  FERRITIN 292  LDH 137  CRP 23.1*    No results found for: SARSCOV2NAA    HG/HCT  stable,      Component Value Date/Time   HGB 13.0 06/26/2020 1212   HCT 39.7 06/26/2020 1212   MCV 85.4 06/26/2020  1212    Recent Labs  Lab 06/26/20 1212  LIPASE 26     ECG: Ordered Personally reviewed by me showing: HR : 111 Rhythm:   Sinus tachycardia  Incomplete RBBB,LVH  nonspecific changes  QTC 434    UA  no evidence of UTI      Urine analysis:    Component Value Date/Time   COLORURINE YELLOW 06/26/2020 [place] 06/26/2020 2303   LABSPEC >1.046 (H) 06/26/2020 2303   PHURINE 5.0 06/26/2020 [place] 06/26/2020 2303   HGBUR MODERATE (A) 06/26/2020 [place] 06/26/2020 2303  KETONESUR NEGATIVE 06/26/2020 2303   PROTEINUR NEGATIVE 06/26/2020 2303   NITRITE NEGATIVE 06/26/2020 2303   LEUKOCYTESUR NEGATIVE 06/26/2020 2303    Ordered   CXR - atelectasis vs consolidation  CTabd/pelvis - Small right pleural effusion that is partially loculated, with fluid tracking into the minor fissure. Heterogeneous right basilar consolidation including at 2.6 cm rounded low-density component dependently     ED Triage Vitals  Enc Vitals Group     BP 06/26/20 1203 (!) 215/98     Pulse Rate 06/26/20 1203 (!) 108     Resp 06/26/20 1336 18     Temp 06/26/20 1203 99.6 F (37.6 C)     Temp Source 06/26/20 1203 Oral     SpO2 06/26/20 1203 96 %     Weight --      Height --      Head Circumference --      Peak Flow --      Pain Score 06/26/20 1205 10     Pain Loc --      Pain Edu? --      Excl. in [place]? --   TMAX(24)@       Latest  Blood pressure (!) 155/82, pulse 92, temperature (!) 101.5 F (38.6 C), resp. rate (!) 27, SpO2 90 %.     Review of Systems:    Pertinent positives include:   Fevers, chills abdominal pain,   chest pain, Constitutional:  No weight loss, night sweats, , fatigue, weight loss  HEENT:  No headaches, Difficulty swallowing,Tooth/dental problems,Sore throat,  No sneezing, itching, ear ache, nasal congestion, post nasal drip,  Cardio-vascular:  No Orthopnea, PND, anasarca, dizziness, palpitations.no Bilateral lower  extremity swelling  GI:  No heartburn, indigestion,nausea, vomiting, diarrhea, change in bowel habits, loss of appetite, melena, blood in stool, hematemesis Resp:  no shortness of breath at rest. No dyspnea on exertion, No excess mucus, no productive cough, No non-productive cough, No coughing up of blood.No change in color of mucus.No wheezing. Skin:  no rash or lesions. No jaundice GU:  no dysuria, change in color of urine, no urgency or frequency. No straining to urinate.  No flank pain.  Musculoskeletal:  No joint pain or no joint swelling. No decreased range of motion. No back pain.  Psych:  No change in mood or affect. No depression or anxiety. No memory loss.  Neuro: no localizing neurological complaints, no tingling, no weakness, no double vision, no gait abnormality, no slurred speech, no confusion  All systems reviewed and apart from [place] all are negative  Past Medical History:  History reviewed. No pertinent past medical history.    History reviewed. No pertinent surgical history.  Social History:  Ambulatory   independently       reports that he has been smoking. He has never used smokeless tobacco. He reports current alcohol use. No history on file for drug use.    Family History:   Family History  Problem Relation Age of Onset  . CAD Neg Hx   . Hypertension Neg Hx   . Diabetes Neg Hx     Allergies: No Known Allergies   Prior to Admission medications   Not on File   Physical Exam: Vitals with BMI 06/26/2020 06/26/2020 09/10/7739  Systolic 287 867 672  Diastolic 82 87 78  Pulse 92 106 109     1. General:  in No Acute distress   Chronically ill -appearing 2. Psychological: Alert and  Oriented 3. Head/ENT:  Dry Mucous Membranes                          Head Non traumatic, neck supple                           Poor Dentition 4. SKIN decreased Skin turgor,  Skin clean Dry and intact no rash 5. Heart: Regular rate and rhythm no  Murmur, no Rub or  gallop 6. Lungs:  no wheezes or crackles   7. Abdomen: Soft,  Right lower tenderness, appears possibly muscular, Non distended  bowel sounds present 8. Lower extremities: no clubbing, cyanosis, no  edema 9. Neurologically Grossly intact, moving all 4 extremities equally   10. MSK: Normal range of motion   All other LABS:     Recent Labs  Lab 06/26/20 1212  WBC 13.2*  HGB 13.0  HCT 39.7  MCV 85.4  PLT 295     Recent Labs  Lab 06/26/20 1212  NA 137  K 3.8  CL 103  CO2 25  GLUCOSE 123*  BUN 10  CREATININE 1.20  CALCIUM 9.2     Recent Labs  Lab 06/26/20 1212  AST 20  ALT 58*  ALKPHOS 91  BILITOT 1.4*  PROT 7.2  ALBUMIN 3.5       Cultures: No results found for: SDES, SPECREQUEST, CULT, REPTSTATUS   Radiological Exams on Admission: DG Chest 2 View  Result Date: 06/26/2020 CLINICAL DATA:  Shortness of breath and abdominal pain. EXAM: CHEST - 2 VIEW COMPARISON:  None. FINDINGS: 2022 hours. Low volumes. The cardio pericardial silhouette is enlarged. Right base collapse/consolidation with small right pleural effusion. Retrocardiac collapse/consolidative opacity noted. The visualized bony structures of the thorax show no acute abnormality. Telemetry leads overlie the chest. IMPRESSION: Low volume film with bibasilar collapse/consolidation and small right pleural effusion. Electronically Signed   By: Misty Stanley M.D.   On: 06/26/2020 20:31   CT Abdomen Pelvis W Contrast  Result Date: 06/26/2020 CLINICAL DATA:  RLQ abdominal pain, appendicitis suspected (Age >= 14y) Lower abdominal pain. EXAM: CT ABDOMEN AND PELVIS WITH CONTRAST TECHNIQUE: Multidetector CT imaging of the abdomen and pelvis was performed using the standard protocol following bolus administration of intravenous contrast. CONTRAST:  171mL OMNIPAQUE IOHEXOL 300 MG/ML  SOLN COMPARISON:  None. FINDINGS: Lower chest: Small right pleural effusion that is partially loculated, with fluid tracking into the minor  fissure. Heterogeneous right basilar consolidation including at 2.6 cm rounded low-density component dependently, series 3, image 14. Basilar emphysema suspected, breathing motion limits detailed assessment. Subsegmental atelectasis in the left lower lobe. Heart is upper normal in size. Hepatobiliary: No focal hepatic abnormality or focal lesion. Gallbladder is decompressed. There is a Phrygian cap. No calcified gallstone or pericholecystic fat stranding. No biliary dilatation. Pancreas: No ductal dilatation or inflammation. No evidence of pancreatic mass. Spleen: Normal in size without focal abnormality. Adrenals/Urinary Tract: Diffuse left adrenal thickening without dominant nodule. Normal right adrenal gland. No hydronephrosis or perinephric edema. There are parapelvic cysts in the upper left kidney. Tiny right kidney cortical hypodensity medially is too small to characterize. A least 4 nonobstructing stones in the mid and lower left kidney. Homogeneous renal enhancement with symmetric excretion on delayed phase imaging. Urinary bladder is completely empty and not well assessed. Stomach/Bowel: Unremarkable stomach. Normal positioning of the duodenum and ligament of Treitz. Fluid within nondilated small bowel in the left abdomen without wall thickening  or inflammation. No evidence of obstruction. Normal appendix, for example series 3, image 69. Moderate stool in the ascending colon. Air-filled transverse colon. Descending colon is decompressed. Sigmoid colon is redundant. No colonic wall thickening, inflammation, or evidence of mass. No significant diverticular disease. Vascular/Lymphatic: Aortic atherosclerosis. No aortic aneurysm. Circumaortic left renal vein. There is a 12 mm portal caval nodes that is nonspecific, series 3, image 35. no pelvic adenopathy. Reproductive: Prostate is unremarkable. Other: No free air, free fluid, or intra-abdominal fluid collection. Small fat containing umbilical hernia.  Musculoskeletal: There are no acute or suspicious osseous abnormalities. No focal bone lesion. IMPRESSION: 1. Normal appendix. 2. Small right pleural effusion that is partially loculated. Heterogeneous right basilar consolidation including at 2.6 cm rounded low-density component dependently. Differential considerations include pneumonia or neoplasm with surrounding atelectasis. Recommend close clinical follow-up and short interval follow-up CT after a course of treatment. Chest radiograph could be obtained for baseline. 3. Nonobstructing left renal stones. Parapelvic cysts in the upper left kidney. 4. Nonspecific 12 mm portal caval lymph node, likely reactive. 5. Possible emphysema in the lung bases, obscured by breathing motion artifact. Aortic Atherosclerosis (ICD10-I70.0) . Electronically Signed   By: Narda Rutherford M.D.   On: 06/26/2020 19:52    Chart has been reviewed    Assessment/Plan 50 y.o. male with medical history significant of elevated BP  Admitted for sepsis, CAP with small pleural effusion  Present on Admission: . CAP (community acquired pneumonia) -  - will admit for treatment of CAP will start on appropriate antibiotic coverage.  Rocephin azithromycin Given small pleural effusion and unusual findings on abdominal CT may need IR consult to obtain sample   Obtain:  sputum cultures,                  blood cultures if febrile or if decompensates.                   strep pneumo UA antigen,                  Provide oxygen as needed.                 COVID still pending Inflammatory markers elevated significance pending results of Covid test  . Sepsis (HCC) -   -SIRS criteria met with  elevated white blood cell count,       Component Value Date/Time   WBC 16.0 (H) 06/26/2020 2100   LYMPHSABS 1.8 06/26/2020 2100    tachycardia   Fever  RR >20 Today's Vitals   06/26/20 2017 06/26/20 2024 06/26/20 2058 06/26/20 2130  BP:  (!) 162/87 (!) 155/82 (!) 146/85  Pulse:  (!) 106 92 91   Resp:  (!) 33 (!) 27 (!) 29  Temp:      TempSrc:      SpO2: 93% 93% 90% 91%  PainSc:          -Most likely source being:  pulmonary,     Patient meeting criteria for Severe sepsis with    evidence of end organ damage/organ dysfunction such as   Acute hypoxia requiring new supplemental oxygen, SpO2: 91 %    - Obtain serial lactic acid and procalcitonin level.  - Initiated IV antibiotics   - await results of blood and urine culture  - Rehydrate aggressively       30cc/kg fluid   10:18 PM  . Pleural effusion -small but given parapneumonic with CT imaging unable to rule out malignancy,  pt refuses CT chest at this time stating that laying flat makes his abdomen hurt, also had recent contrasted abdominal study.    IR consult for possible thoracentesis  . Abdominal pain -CT abdomen not showing any source of abdominal pain.  But per my review showing some stool burden and gas. Also possibly musculoskeletal Will treat supportively  . Accelerated hypertension patient has history of hypertension is untreated.  Administer hydralazine as needed continue to monitor  . Tobacco abuse -    -At this point patient is  interested in quitting (if he has to)  - declined nicotine patch   - nursing tobacco cessation protocol  . Chest pain on respiration -most likely related to consolidation.  Nonischemic in nature, troponn WNL  . Abnormal ECG -will obtain echogram given abnormal EKG and repeat EKG.  Patient does report some chest pain which is worse with coughing and deep inspiration on the right side clinically likely secondary to underlying pneumonia Troponin WNL  . Acute respiratory failure with hypoxia (HCC) -  this patient has acute respiratory failure with Hypoxia as documented by the presence of following: O2 saturatio< 90% on RA  Likely due to:  Pneumonia  Provide O2 therapy and titrate as needed  Continuous pulse ox  check Pulse ox with ambulation prior to discharge  may need  TC  consult for home O2 set up    Other plan as per orders.  DVT prophylaxis:  SCD   Code Status:    Code Status: Not on file FULL CODE  as per patient  I had personally discussed CODE STATUS with patient      Family Communication:  SO at  Bedside  plan of care was discussed on the phone with fianc  Disposition Plan:      To home once workup is complete and patient is stable   Following barriers for discharge:                                                        Pain controlled with PO medications                               Afebrile, white count improving able to transition to PO antibiotics                             Will need to be able to tolerate PO                            Will likely need home health, home O2, set up                       Consults called: IR consult    Admission status:  ED Disposition    ED Disposition Condition [place]: [place] [[id number]]  Level of Care: Telemetry Medical [[id number]]  May admit patient to [facility] or [facility] if equivalent level of care is available:: No  Covid Evaluation: Symptomatic Person Under Investigation (PUI)  Diagnosis: CAP (community acquired pneumonia) [188416]  Admitting Physician: Toy Baker [[id number]]  Attending Physician: Toy Baker [[id number]]  Estimated length of stay: past midnight tomorrow  Certification:: I certify this patient will need inpatient services for at least 2 midnights          inpatient     I Expect 2 midnight stay secondary to severity of patient's current illness need for inpatient interventions justified by the following:  hemodynamic instability despite optimal treatment (tachycardia  tachypnea  hypoxia,  )   Severe lab/radiological/exam abnormalities including:    CAP with pleural effusion .    That are currently affecting medical management.   I expect  patient to be hospitalized for 2 midnights requiring inpatient medical  care.  Patient is at high risk for adverse outcome (such as loss of life or disability) if not treated.  Indication for inpatient stay as follows:    Hemodynamic instability despite maximal medical therapy,    severe pain requiring acute inpatient management,     persistent chest pain despite medical management Need for operative/procedural  intervention New or worsening hypoxia  Need for IV antibiotics, IV fluids, , IV pain medications     Level of care    tele  For 12H    No results found for: SARSCOV2NAA   Precautions: admitted as  PUI  Airborne and Contact precautions  If Covid PCR is negative  - please DC precautions     PPE: Used by the provider:   P100  eye Goggles,  Gloves     Dietrick Barris 06/27/2020, 1:07 AM    [facility]     after 2 AM please page floor coverage PA If 7AM-7PM, please contact the day team taking care of the patient using [email or web address]   Patient was evaluated in the context of the global COVID-19 pandemic, which necessitated consideration that the patient might be at risk for infection with the SARS-CoV-2 virus that causes COVID-19. Institutional protocols and algorithms that pertain to the evaluation of patients at risk for COVID-19 are in a state of rapid change based on information released by regulatory bodies including the CDC and federal and state organizations. These policies and algorithms were followed during the patient's care.

## 2020-06-26 NOTE — ED Notes (Signed)
Pt refusing to got to Ct for second attempt. Provider notified.

## 2020-06-26 NOTE — H&P (Incomplete)
Gary Skinner QTM:[id number] DOB: 23-Jul-1970 DOA: 06/26/2020   PCP: Merryl Hacker, No   Outpatient Specialists:   NONE    Patient arrived to ER on 06/26/20 at 1141 Referred by Attending Fredia Sorrow, MD  Patient coming from: home Lives  With family    Chief Complaint:   Chief Complaint  Patient presents with  . Abdominal Pain    HPI: Gary Skinner is a 50 y.o. male with medical history significant of elevated BP    Presented with lower abdominal pain bloating since yesterday continued to have normal BM, He was at work when it started. Pain is coming and going worse on the light lower abdomen.  Reports 2 days of mild dry cough, no sore throat, no change in appetite SO reported she had COVID about 1 month ago January 10th He did not developed any symptoms back in January He takes Ibuprofen and naproxen for chronic pain He smokes occasionally and drinks He have been told he has high blood pressure but not taking anything   CP on the right side of his chest with deep breath or cough  Infectious risk factors:  Reports fever, cough  Has been vaccinated against COVID not boosted   Initial COVID TEST  NEGATIVE**** POSITIVE,  ***in house  PCR testing  Pending  No results found for: SARSCOV2NAA     While in ER: Reported significant abdominal pain unable to lay down flat given IV Dilaudid after which his O2 sats dropped to 90s on room air 2 L was provided but patient refused to wear them. Initially hypertensive 182/95 tachycardic up to 107 febrile up to 101.9 Noted to have elevated white blood cell count 13.2 meeting SIRS parameters  CT abdomen done incidentally showing heterogeneous right basilar consolidation differential including pneumonia versus neoplasm No abnormalities in the abdomen to explain pain  Chest x-ray was showing bilateral collapse versus consolidation and small right pleural effusion   Hospitalist was called for admission for Pneumonia, acute respiratory failure  with hypoxia  The following Work up has been ordered so far:  Orders Placed This Encounter  Procedures  . Culture, blood (Routine X 2) w Reflex to ID Panel  . CT Abdomen Pelvis W Contrast  . DG Chest 2 View  . Lipase, blood  . Comprehensive metabolic panel  . CBC  . Urinalysis, Routine w reflex microscopic  . Lactic acid, plasma  . Lactic acid, plasma  . CBC WITH DIFFERENTIAL  . D-dimer, quantitative  . Procalcitonin  . Lactate dehydrogenase  . Ferritin  . Triglycerides  . Fibrinogen  . C-reactive protein  . Diet NPO time specified  . Cardiac monitoring  . Initiate Carrier Fluid Protocol  . Place surgical mask on patient  . Patient to wear surgical mask during transportation  . Assess patient for ability to self-prone. If able (can move self in bed, ambulate) and stable (SpO2 and oxygen requirement):  . RN/NT - Document specific oxygen requirements in CHL  . Notify EDP if new oxygen requirements escalates > 4L per minute Gary Skinner  . RN to draw the following extra tubes:  . Consult for Anita Admission  ALL PATIENTS BEING ADMITTED/HAVING PROCEDURES NEED COVID-19 SCREENING  . Airborne and Contact precautions  . Oxygen therapy Mode or (Route): Nasal cannula; Liters Per Minute: 2  . Pulse oximetry, continuous  . POC SARS Coronavirus 2 Ag-ED - Nasal Swab  . ED EKG  . EKG 12-Lead   Following Medications were ordered in ER: Medications  0.9 %  sodium chloride infusion ( Intravenous New Bag/Given 06/26/20 1750)  cefTRIAXone (ROCEPHIN) 1 g in sodium chloride 0.9 % 100 mL IVPB (1 g Intravenous New Bag/Given 06/26/20 2112)  azithromycin (ZITHROMAX) 500 mg in sodium chloride 0.9 % 250 mL IVPB (has no administration in time range)  0.9 %  sodium chloride infusion ( Intravenous New Bag/Given 06/26/20 2113)  sodium chloride 0.9 % bolus 1,000 mL (0 mLs Intravenous Stopped 06/26/20 1751)  ondansetron (ZOFRAN) injection 4 mg (4 mg Intravenous Given 06/26/20 1548)  HYDROmorphone  (DILAUDID) injection 1 mg (1 mg Intravenous Given 06/26/20 1548)  HYDROmorphone (DILAUDID) injection 1 mg (1 mg Intravenous Given 06/26/20 1651)  HYDROmorphone (DILAUDID) injection 1 mg (1 mg Intravenous Given 06/26/20 1748)  morphine 2 MG/ML injection 2 mg (2 mg Intravenous Given 06/26/20 1836)  acetaminophen (TYLENOL) tablet 650 mg (650 mg Oral Given 06/26/20 1840)  sodium chloride 0.9 % bolus 1,000 mL (1,000 mLs Intravenous New Bag/Given 06/26/20 1835)  morphine 2 MG/ML injection 2 mg (2 mg Intravenous Given 06/26/20 1920)  iohexol (OMNIPAQUE) 300 MG/ML solution 100 mL (100 mLs Intravenous Contrast Given 06/26/20 1939)    Significant initial  Findings: Abnormal Labs Reviewed  COMPREHENSIVE METABOLIC PANEL - Abnormal; Notable for the following components:      Result Value   Glucose, Bld 123 (*)    ALT 58 (*)    Total Bilirubin 1.4 (*)    All other components within normal limits  CBC - Abnormal; Notable for the following components:   WBC 13.2 (*)    All other components within normal limits   Otherwise labs showing:    Recent Labs  Lab 06/26/20 1212  NA 137  K 3.8  CO2 25  GLUCOSE 123*  BUN 10  CREATININE 1.20  CALCIUM 9.2    Cr   stable,    Lab Results  Component Value Date   CREATININE 1.20 06/26/2020    Recent Labs  Lab 06/26/20 1212  AST 20  ALT 58*  ALKPHOS 91  BILITOT 1.4*  PROT 7.2  ALBUMIN 3.5   Lab Results  Component Value Date   CALCIUM 9.2 06/26/2020      WBC      Component Value Date/Time   WBC 13.2 (H) 06/26/2020 1212    Plt: Lab Results  Component Value Date   PLT 295 06/26/2020     Lactic Acid, Venous    Component Value Date/Time   LATICACIDVEN 1.9 06/26/2020 1644    Procalcitonin 0.15   COVID-19 Labs  Recent Labs    06/26/20 2100  DDIMER 1.50*  FERRITIN 292  LDH 137  CRP 23.1*    No results found for: SARSCOV2NAA    HG/HCT  stable,      Component Value Date/Time   HGB 13.0 06/26/2020 1212   HCT 39.7 06/26/2020  1212   MCV 85.4 06/26/2020 1212    Recent Labs  Lab 06/26/20 1212  LIPASE 26     ECG: Ordered Personally reviewed by me showing: HR : 111 Rhythm:   Sinus tachycardia  Incomplete RBBB,LVH  nonspecific changes  QTC 434    UA  no evidence of UTI      Urine analysis:    Component Value Date/Time   COLORURINE YELLOW 06/26/2020 [place] 06/26/2020 2303   LABSPEC >1.046 (H) 06/26/2020 2303   PHURINE 5.0 06/26/2020 [place] 06/26/2020 2303   HGBUR MODERATE (A) 06/26/2020 2303   BILIRUBINUR NEGATIVE 06/26/2020  Gary Skinner 06/26/2020 2303   PROTEINUR NEGATIVE 06/26/2020 2303   NITRITE NEGATIVE 06/26/2020 2303   LEUKOCYTESUR NEGATIVE 06/26/2020 2303    Ordered   CXR - atelectasis vs consolidation  CTabd/pelvis - Small right pleural effusion that is partially loculated, with fluid tracking into the minor fissure. Heterogeneous right basilar consolidation including at 2.6 cm rounded low-density component dependently     ED Triage Vitals  Enc Vitals Group     BP 06/26/20 1203 (!) 215/98     Pulse Rate 06/26/20 1203 (!) 108     Resp 06/26/20 1336 18     Temp 06/26/20 1203 99.6 F (37.6 C)     Temp Source 06/26/20 1203 Oral     SpO2 06/26/20 1203 96 %     Weight --      Height --      Head Circumference --      Peak Flow --      Pain Score 06/26/20 1205 10     Pain Loc --      Pain Edu? --      Excl. in [place]? --   TMAX(24)@       Latest  Blood pressure (!) 155/82, pulse 92, temperature (!) 101.5 F (38.6 C), resp. rate (!) 27, SpO2 90 %.     Review of Systems:    Pertinent positives include:   Fevers, chills abdominal pain,   chest pain, Constitutional:  No weight loss, night sweats, , fatigue, weight loss  HEENT:  No headaches, Difficulty swallowing,Tooth/dental problems,Sore throat,  No sneezing, itching, ear ache, nasal congestion, post nasal drip,  Cardio-vascular:  No Orthopnea, PND, anasarca, dizziness,  palpitations.no Bilateral lower extremity swelling  GI:  No heartburn, indigestion,nausea, vomiting, diarrhea, change in bowel habits, loss of appetite, melena, blood in stool, hematemesis Resp:  no shortness of breath at rest. No dyspnea on exertion, No excess mucus, no productive cough, No non-productive cough, No coughing up of blood.No change in color of mucus.No wheezing. Skin:  no rash or lesions. No jaundice GU:  no dysuria, change in color of urine, no urgency or frequency. No straining to urinate.  No flank pain.  Musculoskeletal:  No joint pain or no joint swelling. No decreased range of motion. No back pain.  Psych:  No change in mood or affect. No depression or anxiety. No memory loss.  Neuro: no localizing neurological complaints, no tingling, no weakness, no double vision, no gait abnormality, no slurred speech, no confusion  All systems reviewed and apart from Gary Skinner all are negative  Past Medical History:  History reviewed. No pertinent past medical history.    History reviewed. No pertinent surgical history.  Social History:  Ambulatory   independently       reports that he has been smoking. He has never used smokeless tobacco. He reports current alcohol use. No history on file for drug use.    Family History:   Family History  Problem Relation Age of Onset  . CAD Neg Hx   . Hypertension Neg Hx   . Diabetes Neg Hx     Allergies: No Known Allergies   Prior to Admission medications   Not on File   Physical Exam: Vitals with BMI 06/26/2020 06/26/2020 4/40/1027  Systolic 253 664 403  Diastolic 82 87 78  Pulse 92 106 109     1. General:  in No Acute distress   Chronically ill -appearing 2. Psychological: Alert and  Oriented 3. Head/ENT:  Dry Mucous Membranes                          Head Non traumatic, neck supple                           Poor Dentition 4. SKIN decreased Skin turgor,  Skin clean Dry and intact no rash 5. Heart: Regular rate and  rhythm no*** Murmur, no Rub or gallop 6. Lungs: ***Clear to auscultation bilaterally, no wheezes or crackles   7. Abdomen: Soft, ***non-tender, Non distended *** obese ***bowel sounds present 8. Lower extremities: no clubbing, cyanosis, no  edema 9. Neurologically Grossly intact, moving all 4 extremities equally   10. MSK: Normal range of motion   All other LABS:     Recent Labs  Lab 06/26/20 1212  WBC 13.2*  HGB 13.0  HCT 39.7  MCV 85.4  PLT 295     Recent Labs  Lab 06/26/20 1212  NA 137  K 3.8  CL 103  CO2 25  GLUCOSE 123*  BUN 10  CREATININE 1.20  CALCIUM 9.2     Recent Labs  Lab 06/26/20 1212  AST 20  ALT 58*  ALKPHOS 91  BILITOT 1.4*  PROT 7.2  ALBUMIN 3.5       Cultures: No results found for: SDES, SPECREQUEST, CULT, REPTSTATUS   Radiological Exams on Admission: DG Chest 2 View  Result Date: 06/26/2020 CLINICAL DATA:  Shortness of breath and abdominal pain. EXAM: CHEST - 2 VIEW COMPARISON:  None. FINDINGS: 2022 hours. Low volumes. The cardio pericardial silhouette is enlarged. Right base collapse/consolidation with small right pleural effusion. Retrocardiac collapse/consolidative opacity noted. The visualized bony structures of the thorax show no acute abnormality. Telemetry leads overlie the chest. IMPRESSION: Low volume film with bibasilar collapse/consolidation and small right pleural effusion. Electronically Signed   By: Misty Stanley M.D.   On: 06/26/2020 20:31   CT Abdomen Pelvis W Contrast  Result Date: 06/26/2020 CLINICAL DATA:  RLQ abdominal pain, appendicitis suspected (Age >= 14y) Lower abdominal pain. EXAM: CT ABDOMEN AND PELVIS WITH CONTRAST TECHNIQUE: Multidetector CT imaging of the abdomen and pelvis was performed using the standard protocol following bolus administration of intravenous contrast. CONTRAST:  183mL OMNIPAQUE IOHEXOL 300 MG/ML  SOLN COMPARISON:  None. FINDINGS: Lower chest: Small right pleural effusion that is partially  loculated, with fluid tracking into the minor fissure. Heterogeneous right basilar consolidation including at 2.6 cm rounded low-density component dependently, series 3, image 14. Basilar emphysema suspected, breathing motion limits detailed assessment. Subsegmental atelectasis in the left lower lobe. Heart is upper normal in size. Hepatobiliary: No focal hepatic abnormality or focal lesion. Gallbladder is decompressed. There is a Phrygian cap. No calcified gallstone or pericholecystic fat stranding. No biliary dilatation. Pancreas: No ductal dilatation or inflammation. No evidence of pancreatic mass. Spleen: Normal in size without focal abnormality. Adrenals/Urinary Tract: Diffuse left adrenal thickening without dominant nodule. Normal right adrenal gland. No hydronephrosis or perinephric edema. There are parapelvic cysts in the upper left kidney. Tiny right kidney cortical hypodensity medially is too small to characterize. A least 4 nonobstructing stones in the mid and lower left kidney. Homogeneous renal enhancement with symmetric excretion on delayed phase imaging. Urinary bladder is completely empty and not well assessed. Stomach/Bowel: Unremarkable stomach. Normal positioning of the duodenum and ligament of Treitz. Fluid within nondilated small bowel in the left abdomen without wall thickening or inflammation. No  evidence of obstruction. Normal appendix, for example series 3, image 69. Moderate stool in the ascending colon. Air-filled transverse colon. Descending colon is decompressed. Sigmoid colon is redundant. No colonic wall thickening, inflammation, or evidence of mass. No significant diverticular disease. Vascular/Lymphatic: Aortic atherosclerosis. No aortic aneurysm. Circumaortic left renal vein. There is a 12 mm portal caval nodes that is nonspecific, series 3, image 35. no pelvic adenopathy. Reproductive: Prostate is unremarkable. Other: No free air, free fluid, or intra-abdominal fluid collection.  Small fat containing umbilical hernia. Musculoskeletal: There are no acute or suspicious osseous abnormalities. No focal bone lesion. IMPRESSION: 1. Normal appendix. 2. Small right pleural effusion that is partially loculated. Heterogeneous right basilar consolidation including at 2.6 cm rounded low-density component dependently. Differential considerations include pneumonia or neoplasm with surrounding atelectasis. Recommend close clinical follow-up and short interval follow-up CT after a course of treatment. Chest radiograph could be obtained for baseline. 3. Nonobstructing left renal stones. Parapelvic cysts in the upper left kidney. 4. Nonspecific 12 mm portal caval lymph node, likely reactive. 5. Possible emphysema in the lung bases, obscured by breathing motion artifact. Aortic Atherosclerosis (ICD10-I70.0) . Electronically Signed   By: Narda Rutherford M.D.   On: 06/26/2020 19:52    Chart has been reviewed    Assessment/Plan 50 y.o. male with medical history significant of elevated BP  Admitted for sepsis, CAP with small pleural effusion  Present on Admission: . CAP (community acquired pneumonia) -  - will admit for treatment of CAP will start on appropriate antibiotic coverage.  Rocephin azithromycin Given small pleural effusion and unusual findings on abdominal CT may need IR consult to obtain sample   Obtain:  sputum cultures,                  blood cultures if febrile or if decompensates.                   strep pneumo UA antigen,                  Provide oxygen as needed.   . Sepsis (HCC) -   -SIRS criteria met with  elevated white blood cell count,       Component Value Date/Time   WBC 16.0 (H) 06/26/2020 2100   LYMPHSABS 1.8 06/26/2020 2100    tachycardia   Fever  RR >20 Today's Vitals   06/26/20 2017 06/26/20 2024 06/26/20 2058 06/26/20 2130  BP:  (!) 162/87 (!) 155/82 (!) 146/85  Pulse:  (!) 106 92 91  Resp:  (!) 33 (!) 27 (!) 29  Temp:      TempSrc:      SpO2: 93%  93% 90% 91%  PainSc:          -Most likely source being:  pulmonary,     Patient meeting criteria for Severe sepsis with    evidence of end organ damage/organ dysfunction such as   Acute hypoxia requiring new supplemental oxygen, SpO2: 91 %    - Obtain serial lactic acid and procalcitonin level.  - Initiated IV antibiotics   - await results of blood and urine culture  - Rehydrate aggressively       30cc/kg fluid   10:18 PM  . Pleural effusion -small but given parapneumonic with CT imaging unable to rule out malignancy, pt refuses CT chest at this time stating that laying flat makes his abdomen hurt, also had recent contrasted abdominal study.  *** IR consult  . Abdominal pain -CT  abdomen not showing any source of abdominal pain.  But per my review showing some stool burden and gas.  Will treat supportively  . Accelerated hypertension patient has history of hypertension is untreated.  Administer hydralazine as needed continue to monitor  . Tobacco abuse - - Spoke about importance of quitting spent 5 minutes discussing options for treatment, prior attempts at quitting, and dangers of smoking  -At this point patient is  ***NOT  interested in quitting  - order nicotine patch   - nursing tobacco cessation protocol  . Chest pain on respiration -most likely related to consolidation.  Nonischemic in nature  . Abnormal ECG -will obtain echogram given abnormal EKG and repeat EKG.  Patient does report some chest pain which is worse with coughing and deep inspiration on the right side clinically likely secondary to underlying pneumonia  . Acute respiratory failure with hypoxia (HCC) -  this patient has acute respiratory failure with Hypoxia as documented by the presence of following: O2 saturatio< 90% on RA  Likely due to:  Pneumonia  Provide O2 therapy and titrate as needed  Continuous pulse ox  check Pulse ox with ambulation prior to discharge  may need  TC consult for home O2 set up     Other plan as per orders.  DVT prophylaxis:  SCD   Code Status:    Code Status: Not on file FULL CODE  as per patient  I had personally discussed CODE STATUS with patient      Family Communication:  SO at  Bedside  plan of care was discussed on the phone with fianc  Disposition Plan:      To home once workup is complete and patient is stable   Following barriers for discharge:                                                        Pain controlled with PO medications                               Afebrile, white count improving able to transition to PO antibiotics                             Will need to be able to tolerate PO                            Will likely need home health, home O2, set up                       Consults called: ***    Admission status:  ED Disposition    ED Disposition Condition [place]: [place] [100100]  Level of Care: Telemetry Medical [104]  May admit patient to [facility] or [facility] if equivalent level of care is available:: No  Covid Evaluation: Symptomatic Person Under Investigation (PUI)  Diagnosis: CAP (community acquired pneumonia) [098119]  Admitting Physician: Toy Baker [[id number]]  Attending Physician: Toy Baker [[id number]]  Estimated length of stay: past midnight tomorrow  Certification:: I certify this patient will need inpatient services for at least 2 midnights  inpatient     I Expect 2 midnight stay secondary to severity of patient's current illness need for inpatient interventions justified by the following:  hemodynamic instability despite optimal treatment (tachycardia  tachypnea  hypoxia,  )   Severe lab/radiological/exam abnormalities including:    CAP with pleural effusion .    That are currently affecting medical management.   I expect  patient to be hospitalized for 2 midnights requiring inpatient medical care.  Patient is at high risk for  adverse outcome (such as loss of life or disability) if not treated.  Indication for inpatient stay as follows:    Hemodynamic instability despite maximal medical therapy,    severe pain requiring acute inpatient management,     persistent chest pain despite medical management Need for operative/procedural  intervention New or worsening hypoxia  Need for IV antibiotics, IV fluids, , IV pain medications     Level of care    tele  For 12H    No results found for: SARSCOV2NAA   Precautions: admitted as  PUI *** covid positive Airborne and Contact precautions ***If Covid PCR is negative  - please DC precautions - would need additional investigation given very high risk for false native test result   PPE: Used by the provider:   P100  eye Goggles,  Gloves     Gary Skinner 06/26/2020, 10:25 PM ***  [facility]     after 2 AM please page floor coverage PA If 7AM-7PM, please contact the day team taking care of the patient using [email or web address]   Patient was evaluated in the context of the global COVID-19 pandemic, which necessitated consideration that the patient might be at risk for infection with the SARS-CoV-2 virus that causes COVID-19. Institutional protocols and algorithms that pertain to the evaluation of patients at risk for COVID-19 are in a state of rapid change based on information released by regulatory bodies including the CDC and federal and state organizations. These policies and algorithms were followed during the patient's care.

## 2020-06-27 ENCOUNTER — Inpatient Hospital Stay (HOSPITAL_COMMUNITY): Payer: 59

## 2020-06-27 ENCOUNTER — Other Ambulatory Visit: Payer: Self-pay

## 2020-06-27 DIAGNOSIS — R9431 Abnormal electrocardiogram [ECG] [EKG]: Secondary | ICD-10-CM | POA: Diagnosis not present

## 2020-06-27 DIAGNOSIS — R609 Edema, unspecified: Secondary | ICD-10-CM

## 2020-06-27 DIAGNOSIS — R109 Unspecified abdominal pain: Secondary | ICD-10-CM

## 2020-06-27 DIAGNOSIS — J90 Pleural effusion, not elsewhere classified: Secondary | ICD-10-CM

## 2020-06-27 DIAGNOSIS — A419 Sepsis, unspecified organism: Principal | ICD-10-CM

## 2020-06-27 DIAGNOSIS — R6520 Severe sepsis without septic shock: Secondary | ICD-10-CM

## 2020-06-27 DIAGNOSIS — I10 Essential (primary) hypertension: Secondary | ICD-10-CM | POA: Diagnosis not present

## 2020-06-27 DIAGNOSIS — J9601 Acute respiratory failure with hypoxia: Secondary | ICD-10-CM | POA: Diagnosis not present

## 2020-06-27 DIAGNOSIS — J189 Pneumonia, unspecified organism: Secondary | ICD-10-CM

## 2020-06-27 DIAGNOSIS — I351 Nonrheumatic aortic (valve) insufficiency: Secondary | ICD-10-CM

## 2020-06-27 LAB — EXPECTORATED SPUTUM ASSESSMENT W GRAM STAIN, RFLX TO RESP C

## 2020-06-27 LAB — COMPREHENSIVE METABOLIC PANEL WITH GFR
ALT: 53 U/L — ABNORMAL HIGH (ref 0–44)
AST: 20 U/L (ref 15–41)
Albumin: 3.3 g/dL — ABNORMAL LOW (ref 3.5–5.0)
Alkaline Phosphatase: 98 U/L (ref 38–126)
Anion gap: 11 (ref 5–15)
BUN: 7 mg/dL (ref 6–20)
CO2: 23 mmol/L (ref 22–32)
Calcium: 8.6 mg/dL — ABNORMAL LOW (ref 8.9–10.3)
Chloride: 102 mmol/L (ref 98–111)
Creatinine, Ser: 1 mg/dL (ref 0.61–1.24)
GFR, Estimated: >60 mL/min
Glucose, Bld: 141 mg/dL — ABNORMAL HIGH (ref 70–99)
Potassium: 4 mmol/L (ref 3.5–5.1)
Sodium: 136 mmol/L (ref 135–145)
Total Bilirubin: 1.1 mg/dL (ref 0.3–1.2)
Total Protein: 7.2 g/dL (ref 6.5–8.1)

## 2020-06-27 LAB — CBC WITH DIFFERENTIAL/PLATELET
Abs Immature Granulocytes: 0.06 10*3/uL (ref 0.00–0.07)
Basophils Absolute: 0.1 10*3/uL (ref 0.0–0.1)
Basophils Relative: 0 %
Eosinophils Absolute: 0 10*3/uL (ref 0.0–0.5)
Eosinophils Relative: 0 %
HCT: 39.1 % (ref 39.0–52.0)
Hemoglobin: 12.7 g/dL — ABNORMAL LOW (ref 13.0–17.0)
Immature Granulocytes: 0 %
Lymphocytes Relative: 17 %
Lymphs Abs: 2.7 10*3/uL (ref 0.7–4.0)
MCH: 27.9 pg (ref 26.0–34.0)
MCHC: 32.5 g/dL (ref 30.0–36.0)
MCV: 85.9 fL (ref 80.0–100.0)
Monocytes Absolute: 1.2 10*3/uL — ABNORMAL HIGH (ref 0.1–1.0)
Monocytes Relative: 7 %
Neutro Abs: 12.4 10*3/uL — ABNORMAL HIGH (ref 1.7–7.7)
Neutrophils Relative %: 76 %
Platelets: 295 10*3/uL (ref 150–400)
RBC: 4.55 MIL/uL (ref 4.22–5.81)
RDW: 14.6 % (ref 11.5–15.5)
WBC: 16.4 10*3/uL — ABNORMAL HIGH (ref 4.0–10.5)
nRBC: 0 % (ref 0.0–0.2)

## 2020-06-27 LAB — PHOSPHORUS: Phosphorus: 3.5 mg/dL (ref 2.5–4.6)

## 2020-06-27 LAB — ECHOCARDIOGRAM COMPLETE
Area-P 1/2: 3.53 cm2
Height: 72 in
S' Lateral: 3.8 cm
Weight: 3840 oz

## 2020-06-27 LAB — MAGNESIUM: Magnesium: 1.9 mg/dL (ref 1.7–2.4)

## 2020-06-27 LAB — CK: Total CK: 122 U/L (ref 49–397)

## 2020-06-27 LAB — RAPID URINE DRUG SCREEN, HOSP PERFORMED
Amphetamines: NOT DETECTED
Barbiturates: NOT DETECTED
Benzodiazepines: NOT DETECTED
Cocaine: POSITIVE — AB
Opiates: POSITIVE — AB
Tetrahydrocannabinol: NOT DETECTED

## 2020-06-27 LAB — LACTIC ACID, PLASMA
Lactic Acid, Venous: 0.9 mmol/L (ref 0.5–1.9)
Lactic Acid, Venous: 1.2 mmol/L (ref 0.5–1.9)

## 2020-06-27 LAB — STREP PNEUMONIAE URINARY ANTIGEN: Strep Pneumo Urinary Antigen: NEGATIVE

## 2020-06-27 LAB — TSH: TSH: 1.363 u[IU]/mL (ref 0.350–4.500)

## 2020-06-27 LAB — SARS CORONAVIRUS 2 (TAT 6-24 HRS): SARS Coronavirus 2: NEGATIVE

## 2020-06-27 LAB — PROCALCITONIN: Procalcitonin: 0.2 ng/mL

## 2020-06-27 LAB — TROPONIN I (HIGH SENSITIVITY): Troponin I (High Sensitivity): 11 ng/L (ref <18)

## 2020-06-27 LAB — HIV ANTIBODY (ROUTINE TESTING W REFLEX): HIV Screen 4th Generation wRfx: NONREACTIVE

## 2020-06-27 MED ORDER — MELATONIN 5 MG PO TABS
5.0000 mg | ORAL_TABLET | Freq: Every day | ORAL | Status: DC
  Administered 2020-06-27 – 2020-06-29 (×3): 5 mg via ORAL
  Filled 2020-06-27 (×3): qty 1

## 2020-06-27 MED ORDER — LIDOCAINE HCL 1 % IJ SOLN
INTRAMUSCULAR | Status: AC
  Filled 2020-06-27: qty 20

## 2020-06-27 MED ORDER — LORAZEPAM 2 MG/ML IJ SOLN
1.0000 mg | Freq: Once | INTRAMUSCULAR | Status: AC
  Administered 2020-06-27: 1 mg via INTRAVENOUS
  Filled 2020-06-27: qty 1

## 2020-06-27 MED ORDER — METHOCARBAMOL 1000 MG/10ML IJ SOLN
500.0000 mg | Freq: Four times a day (QID) | INTRAVENOUS | Status: DC | PRN
  Administered 2020-06-27: 500 mg via INTRAVENOUS
  Filled 2020-06-27: qty 5

## 2020-06-27 MED ORDER — AMLODIPINE BESYLATE 5 MG PO TABS
5.0000 mg | ORAL_TABLET | Freq: Every day | ORAL | Status: DC
  Administered 2020-06-27: 5 mg via ORAL
  Filled 2020-06-27: qty 1

## 2020-06-27 MED ORDER — SODIUM CHLORIDE 0.45 % IV SOLN: INTRAVENOUS | Status: DC

## 2020-06-27 MED ORDER — MORPHINE SULFATE (PF) 2 MG/ML IV SOLN
2.0000 mg | INTRAVENOUS | Status: DC | PRN
  Administered 2020-06-27 (×3): 2 mg via INTRAVENOUS
  Filled 2020-06-27 (×3): qty 1

## 2020-06-27 NOTE — ED Notes (Signed)
Attempted report x1. 

## 2020-06-27 NOTE — Progress Notes (Signed)
VASCULAR LAB    Bilateral lower extremity venous duplex has been performed.  See CV proc for preliminary results.   Yaslin Kirtley, RVT 06/27/2020, 1:35 PM

## 2020-06-27 NOTE — ED Notes (Signed)
Pt ambulates to restroom, tolerated on room air and no signs of distress. O2 remains >90%.

## 2020-06-27 NOTE — Progress Notes (Signed)
[facility] PROGRESS NOTE   Gary Skinner JEH:[id number] DOB: 03-29-1971 DOA: 06/26/2020  PCP: Pcp, No  Brief History/Interval Summary: 50 year old African-American male with a past medical history of essential hypertension presented with complains of abdominal pain and bloating.  Also noted to have a dry cough for the past few days.  Reported being positive for COVID-19 about a month ago.  He however did not have any symptoms at that time.  CT scan raise concern for pneumonia.  Hospitalized for further management.  Consultants: None  Procedures: Thoracentesis was attempted however not successful  Antibiotics: Anti-infectives (From admission, onward)   Start     Dose/Rate Route Frequency Ordered Stop   06/27/20 2300  azithromycin (ZITHROMAX) 500 mg in sodium chloride 0.9 % 250 mL IVPB        500 mg 250 mL/hr over 60 Minutes Intravenous Every 24 hours 06/26/20 2304     06/27/20 2100  cefTRIAXone (ROCEPHIN) 1 g in sodium chloride 0.9 % 100 mL IVPB        1 g 200 mL/hr over 30 Minutes Intravenous Every 24 hours 06/26/20 2304     06/26/20 2100  cefTRIAXone (ROCEPHIN) 1 g in sodium chloride 0.9 % 100 mL IVPB        1 g 200 mL/hr over 30 Minutes Intravenous  Once 06/26/20 2045 06/26/20 2302   06/26/20 2100  azithromycin (ZITHROMAX) 500 mg in sodium chloride 0.9 % 250 mL IVPB        500 mg 250 mL/hr over 60 Minutes Intravenous  Once 06/26/20 2045 06/27/20 0107      Subjective/Interval History: Patient mentions that he is feeling no better compared to yesterday.  Continues to have abdominal pain cough and shortness of breath.  No nausea or vomiting.     Assessment/Plan:  Community-acquired pneumonia/sepsis present on admission/acute respiratory failure with hypoxia Patient had leukocytosis fever tachycardia.  Found to have pneumonia in ED right lung.  Patient started on ceftriaxone and azithromycin.  Follow-up on culture data. Saturations were in the high 80s and he was  placed on oxygen at 2 L/min.  Currently saturating normal on room air.  Continue to monitor. COVID-19 test was negative.  HIV nonreactive.  TSH 1.36. Urine strep pneumo antigen was negative.  Right-sided pleural effusion Likely parapneumonic.  There was some concern for some loculation.  Thoracentesis was ordered.  However did not have a good window to tap the fluid.  It was small amount to begin with.  We will treat him with antibacterials for now.  He will need repeat films in a few weeks.  Abdominal pain Possibly due to his pneumonia.  CT scan did not show any source of abdominal pain.  LFTs are unremarkable for the most part.  Lipase was normal.  Could also be due to constipation.  Supportive treatment for now.  Accelerated hypertension Patient has a history of hypertension but is untreated.  Currently on hydralazine as needed.  Initiate amlodipine.  Abnormal EKG ST segment changes noted on EKG in leads V1 V2.  Troponins however are normal.  EKG was repeated which showed persistent changes.  Patient denies any chest pain currently.  Follow-up on echocardiogram.  Tobacco abuse Counseled.  Obesity Estimated body mass index is 32.55 kg/m as calculated from the following:   Height as of this encounter: 6' (1.829 m).   Weight as of this encounter: 108.9 kg.    DVT Prophylaxis: Lovenox Code Status: Full code Family Communication: Discussed with the patient Disposition  Plan: Hopefully return home when improved  Status is: Inpatient  Remains inpatient appropriate because:Ongoing diagnostic testing needed not appropriate for outpatient work up, IV treatments appropriate due to intensity of illness or inability to take PO and Inpatient level of care appropriate due to severity of illness   Dispo: The patient is from: Home              Anticipated d/c is to: Home              Anticipated d/c date is: 2 days              Patient currently is not medically stable to d/c.   Difficult  to place patient No      Medications:  Scheduled: . docusate sodium  100 mg Oral BID  . senna  1 tablet Oral BID   Continuous: . sodium chloride Stopped (06/27/20 1042)  . sodium chloride Stopped (06/26/20 2302)  . azithromycin    . cefTRIAXone (ROCEPHIN)  IV    . methocarbamol (ROBAXIN) IV Stopped (06/27/20 7078)   MLJ:QGBEEFEOFHQRF **OR** acetaminophen, albuterol, bisacodyl, hydrALAZINE, HYDROcodone-acetaminophen, methocarbamol (ROBAXIN) IV, morphine, ondansetron **OR** ondansetron (ZOFRAN) IV, polyethylene glycol   Objective:  Vital Signs  Vitals:   06/27/20 0503 06/27/20 0805 06/27/20 0846 06/27/20 1025  BP:  (!) 177/74  (!) 175/87  Pulse:  (!) 101  (!) 102  Resp:  (!) 31  (!) 23  Temp:   99.5 F (37.5 C)   TempSrc:   Oral   SpO2:  94%  92%  Weight: 108.9 kg     Height: 6' (1.829 m)       Intake/Output Summary (Last 24 hours) at 06/27/2020 1053 Last data filed at 06/27/2020 1042 Gross per 24 hour  Intake 1686.67 ml  Output 1400 ml  Net 286.67 ml   Filed Weights   06/27/20 0503  Weight: 108.9 kg    General appearance: Awake alert.  In no distress Resp: Mildly tachypneic at rest.  Crackles in the right base.  Occasional wheezing.  No rhonchi. Cardio: S1-S2 is normal regular.  No S3-S4.  No rubs murmurs or bruit GI: Abdomen is soft.  Vague tenderness diffusely without any rebound rigidity or guarding.  No masses organomegaly.  Bowel sounds present  Extremities: No edema.  Full range of motion of lower extremities. Neurologic: Alert and oriented x3.  No focal neurological deficits.    Lab Results:  Data Reviewed: I have personally reviewed following labs and imaging studies  CBC: Recent Labs  Lab 06/26/20 1212 06/26/20 2100 06/27/20 0000  WBC 13.2* 16.0* 16.4*  NEUTROABS  --  13.2* 12.4*  HGB 13.0 13.3 12.7*  HCT 39.7 42.6 39.1  MCV 85.4 86.2 85.9  PLT 295 298 295    Basic Metabolic Panel: Recent Labs  Lab 06/26/20 1212 06/27/20 0000  NA  137 136  K 3.8 4.0  CL 103 102  CO2 25 23  GLUCOSE 123* 141*  BUN 10 7  CREATININE 1.20 1.00  CALCIUM 9.2 8.6*  MG  --  1.9  PHOS  --  3.5    GFR: Estimated Creatinine Clearance: 111.4 mL/min (by C-G formula based on SCr of 1 mg/dL).  Liver Function Tests: Recent Labs  Lab 06/26/20 1212 06/27/20 0000  AST 20 20  ALT 58* 53*  ALKPHOS 91 98  BILITOT 1.4* 1.1  PROT 7.2 7.2  ALBUMIN 3.5 3.3*    Recent Labs  Lab 06/26/20 1212  LIPASE 26  Cardiac Enzymes: Recent Labs  Lab 06/27/20 0000  CKTOTAL 122     Lipid Profile: Recent Labs    06/26/20 2100  TRIG 41    Thyroid Function Tests: Recent Labs    06/27/20 0000  TSH 1.363    Anemia Panel: Recent Labs    06/26/20 2100  FERRITIN 292    Recent Results (from the past 240 hour(s))  Culture, blood (Routine X 2) w Reflex to ID Panel     Status: None (Preliminary result)   Collection Time: 06/26/20  3:25 PM   Specimen: BLOOD  Result Value Ref Range Status   Specimen Description BLOOD SITE NOT SPECIFIED  Final   Special Requests   Final    BOTTLES DRAWN AEROBIC AND ANAEROBIC Blood Culture results may not be optimal due to an excessive volume of blood received in culture bottles   Culture   Final    NO GROWTH < 24 HOURS Performed at [facility], [address] [place], [place] [address]    Report Status PENDING  Incomplete  SARS CORONAVIRUS 2 (TAT 6-24 HRS) Nasopharyngeal Nasopharyngeal Swab     Status: None   Collection Time: 06/26/20  9:00 PM   Specimen: Nasopharyngeal Swab  Result Value Ref Range Status   SARS Coronavirus 2 NEGATIVE NEGATIVE Final    Comment: (NOTE) SARS-CoV-2 target nucleic acids are NOT DETECTED.  The SARS-CoV-2 RNA is generally detectable in upper and lower respiratory specimens during the acute phase of infection. Negative results do not preclude SARS-CoV-2 infection, do not rule out co-infections with other pathogens, and should not be used as the sole basis for  treatment or other patient management decisions. Negative results must be combined with clinical observations, patient history, and epidemiological information. The expected result is Negative.  Fact Sheet for Patients: HairSlick.nohttps://www.fda.gov/media/138098/download  Fact Sheet for Healthcare Providers: quierodirigir.comhttps://www.fda.gov/media/138095/download  This test is not yet approved or cleared by the [place] FDA and  has been authorized for detection and/or diagnosis of SARS-CoV-2 by FDA under an Emergency Use Authorization (EUA). This EUA will remain  in effect (meaning this test can be used) for the duration of the COVID-19 declaration under Se ction 564(b)(1) of the Act, 21 U.S.C. section 360bbb-3(b)(1), unless the authorization is terminated or revoked sooner.  Performed at [facility], [address] [place], [place] [address]   Culture, sputum-assessment     Status: None   Collection Time: 06/26/20  9:32 PM   Specimen: Expectorated Sputum  Result Value Ref Range Status   Specimen Description EXPECTORATED SPUTUM  Final   Special Requests NONE  Final   Sputum evaluation   Final    THIS SPECIMEN IS ACCEPTABLE FOR SPUTUM CULTURE Performed at [facility], [address] [place], [place] [address]    Report Status 06/27/2020 FINAL  Final  Culture, Respiratory w Gram Stain     Status: None (Preliminary result)   Collection Time: 06/26/20  9:32 PM  Result Value Ref Range Status   Specimen Description EXPECTORATED SPUTUM  Final   Special Requests NONE Reflexed from [id number]  Final   Gram Stain   Final    RARE WBC PRESENT, PREDOMINANTLY PMN MODERATE GRAM POSITIVE COCCI FEW GRAM NEGATIVE RODS RARE GRAM POSITIVE RODS Performed at [facility], [address] [place], [place] [address]    Culture PENDING  Incomplete   Report Status PENDING  Incomplete      Radiology Studies: DG Chest 2 View  Result Date: 06/26/2020 CLINICAL DATA:  Shortness of breath and  abdominal pain. EXAM: CHEST - 2 VIEW COMPARISON:  None. FINDINGS: 2022 hours. Low volumes. The cardio pericardial silhouette is enlarged. Right base collapse/consolidation with small right pleural effusion. Retrocardiac collapse/consolidative opacity noted. The visualized bony structures of the thorax show no acute abnormality. Telemetry leads overlie the chest. IMPRESSION: Low volume film with bibasilar collapse/consolidation and small right pleural effusion. Electronically Signed   By: Kennith Center M.D.   On: 06/26/2020 20:31   CT Abdomen Pelvis W Contrast  Result Date: 06/26/2020 CLINICAL DATA:  RLQ abdominal pain, appendicitis suspected (Age >= 14y) Lower abdominal pain. EXAM: CT ABDOMEN AND PELVIS WITH CONTRAST TECHNIQUE: Multidetector CT imaging of the abdomen and pelvis was performed using the standard protocol following bolus administration of intravenous contrast. CONTRAST:  OMNIPAQUE IOHEXOL 300 MG/ML  SOLN COMPARISON:  None. FINDINGS: Lower chest: Small right pleural effusion that is partially loculated, with fluid tracking into the minor fissure. Heterogeneous right basilar consolidation including at 2.6 cm rounded low-density component dependently, series 3, image 14. Basilar emphysema suspected, breathing motion limits detailed assessment. Subsegmental atelectasis in the left lower lobe. Heart is upper normal in size. Hepatobiliary: No focal hepatic abnormality or focal lesion. Gallbladder is decompressed. There is a Phrygian cap. No calcified gallstone or pericholecystic fat stranding. No biliary dilatation. Pancreas: No ductal dilatation or inflammation. No evidence of pancreatic mass. Spleen: Normal in size without focal abnormality. Adrenals/Urinary Tract: Diffuse left adrenal thickening without dominant nodule. Normal right adrenal gland. No hydronephrosis or perinephric edema. There are parapelvic cysts in the upper left kidney. Tiny right kidney cortical hypodensity medially is too  small to characterize. A least 4 nonobstructing stones in the mid and lower left kidney. Homogeneous renal enhancement with symmetric excretion on delayed phase imaging. Urinary bladder is completely empty and not well assessed. Stomach/Bowel: Unremarkable stomach. Normal positioning of the duodenum and ligament of Treitz. Fluid within nondilated small bowel in the left abdomen without wall thickening or inflammation. No evidence of obstruction. Normal appendix, for example series 3, image 69. Moderate stool in the ascending colon. Air-filled transverse colon. Descending colon is decompressed. Sigmoid colon is redundant. No colonic wall thickening, inflammation, or evidence of mass. No significant diverticular disease. Vascular/Lymphatic: Aortic atherosclerosis. No aortic aneurysm. Circumaortic left renal vein. There is a 12 mm portal caval nodes that is nonspecific, series 3, image 35. no pelvic adenopathy. Reproductive: Prostate is unremarkable. Other: No free air, free fluid, or intra-abdominal fluid collection. Small fat containing umbilical hernia. Musculoskeletal: There are no acute or suspicious osseous abnormalities. No focal bone lesion. IMPRESSION: 1. Normal appendix. 2. Small right pleural effusion that is partially loculated. Heterogeneous right basilar consolidation including at 2.6 cm rounded low-density component dependently. Differential considerations include pneumonia or neoplasm with surrounding atelectasis. Recommend close clinical follow-up and short interval follow-up CT after a course of treatment. Chest radiograph could be obtained for baseline. 3. Nonobstructing left renal stones. Parapelvic cysts in the upper left kidney. 4. Nonspecific 12 mm portal caval lymph node, likely reactive. 5. Possible emphysema in the lung bases, obscured by breathing motion artifact. Aortic Atherosclerosis (ICD10-I70.0) . Electronically Signed   By: Narda Rutherford M.D.   On: 06/26/2020 19:52       LOS: 1  day   Ashrith Sagan  [facility] Pager on [email or web address]  06/27/2020, 10:53 AM

## 2020-06-27 NOTE — ED Notes (Signed)
Pt has been transported to IR for procedure.

## 2020-06-27 NOTE — Progress Notes (Signed)
  Echocardiogram 2D Echocardiogram has been performed.  Gary Skinner Gary Skinner 06/27/2020, 2:43 PM

## 2020-06-27 NOTE — ED Notes (Signed)
Patient transported to Vascular Lab. ?

## 2020-06-27 NOTE — Progress Notes (Signed)
Patient requesting to not have bed alarm on at this time. Significant other is at bedside and said she would assist him if he needs to get up.

## 2020-06-28 DIAGNOSIS — J189 Pneumonia, unspecified organism: Secondary | ICD-10-CM | POA: Diagnosis not present

## 2020-06-28 DIAGNOSIS — I10 Essential (primary) hypertension: Secondary | ICD-10-CM | POA: Diagnosis not present

## 2020-06-28 DIAGNOSIS — R9431 Abnormal electrocardiogram [ECG] [EKG]: Secondary | ICD-10-CM | POA: Diagnosis not present

## 2020-06-28 LAB — CBC
HCT: 38.4 % — ABNORMAL LOW (ref 39.0–52.0)
Hemoglobin: 12.5 g/dL — ABNORMAL LOW (ref 13.0–17.0)
MCH: 27.4 pg (ref 26.0–34.0)
MCHC: 32.6 g/dL (ref 30.0–36.0)
MCV: 84.2 fL (ref 80.0–100.0)
Platelets: 323 10*3/uL (ref 150–400)
RBC: 4.56 MIL/uL (ref 4.22–5.81)
RDW: 14.2 % (ref 11.5–15.5)
WBC: 15.7 10*3/uL — ABNORMAL HIGH (ref 4.0–10.5)
nRBC: 0 % (ref 0.0–0.2)

## 2020-06-28 LAB — COMPREHENSIVE METABOLIC PANEL
ALT: 39 U/L (ref 0–44)
AST: 19 U/L (ref 15–41)
Albumin: 3 g/dL — ABNORMAL LOW (ref 3.5–5.0)
Alkaline Phosphatase: 110 U/L (ref 38–126)
Anion gap: 11 (ref 5–15)
BUN: 6 mg/dL (ref 6–20)
CO2: 23 mmol/L (ref 22–32)
Calcium: 8.7 mg/dL — ABNORMAL LOW (ref 8.9–10.3)
Chloride: 102 mmol/L (ref 98–111)
Creatinine, Ser: 0.93 mg/dL (ref 0.61–1.24)
GFR, Estimated: >60 mL/min (ref >60)
Glucose, Bld: 135 mg/dL — ABNORMAL HIGH (ref 70–99)
Potassium: 3.9 mmol/L (ref 3.5–5.1)
Sodium: 136 mmol/L (ref 135–145)
Total Bilirubin: 1.8 mg/dL — ABNORMAL HIGH (ref 0.3–1.2)
Total Protein: 6.7 g/dL (ref 6.5–8.1)

## 2020-06-28 LAB — PROCALCITONIN: Procalcitonin: 0.28 ng/mL

## 2020-06-28 MED ORDER — GUAIFENESIN ER 600 MG PO TB12
600.0000 mg | ORAL_TABLET | Freq: Two times a day (BID) | ORAL | Status: DC
  Administered 2020-06-28 – 2020-06-30 (×5): 600 mg via ORAL
  Filled 2020-06-28 (×5): qty 1

## 2020-06-28 MED ORDER — DIPHENHYDRAMINE HCL 25 MG PO CAPS
25.0000 mg | ORAL_CAPSULE | Freq: Every evening | ORAL | Status: DC | PRN
  Administered 2020-06-28 – 2020-06-29 (×3): 25 mg via ORAL
  Filled 2020-06-28 (×3): qty 1

## 2020-06-28 MED ORDER — AMLODIPINE BESYLATE 10 MG PO TABS
10.0000 mg | ORAL_TABLET | Freq: Every day | ORAL | Status: DC
  Administered 2020-06-28 – 2020-06-30 (×3): 10 mg via ORAL
  Filled 2020-06-28 (×2): qty 1
  Filled 2020-06-28: qty 2
  Filled 2020-06-28: qty 1

## 2020-06-28 MED ORDER — ASPIRIN EC 81 MG PO TBEC
81.0000 mg | DELAYED_RELEASE_TABLET | Freq: Every day | ORAL | Status: DC
Start: 2020-06-28 — End: 2020-06-30
  Administered 2020-06-28 – 2020-06-30 (×3): 81 mg via ORAL
  Filled 2020-06-28 (×3): qty 1

## 2020-06-28 NOTE — Progress Notes (Signed)
[facility] PROGRESS NOTE   Daanish Copes SHF:[id number] DOB: 03/08/1971 DOA: 06/26/2020  PCP: Pcp, No  Brief History/Interval Summary: 50 year old African-American male with a past medical history of essential hypertension presented with complains of abdominal pain and bloating.  Also noted to have a dry cough for the past few days.  Reported being positive for COVID-19 about a month ago.  He however did not have any symptoms at that time.  CT scan raise concern for pneumonia.  Hospitalized for further management.  Consultants: None  Procedures:  Thoracentesis was attempted however not successful Transthoracic echocardiogram  Antibiotics: Anti-infectives (From admission, onward)   Start     Dose/Rate Route Frequency Ordered Stop   06/27/20 2300  azithromycin (ZITHROMAX) 500 mg in sodium chloride 0.9 % 250 mL IVPB        500 mg 250 mL/hr over 60 Minutes Intravenous Every 24 hours 06/26/20 2304     06/27/20 2100  cefTRIAXone (ROCEPHIN) 1 g in sodium chloride 0.9 % 100 mL IVPB        1 g 200 mL/hr over 30 Minutes Intravenous Every 24 hours 06/26/20 2304     06/26/20 2100  cefTRIAXone (ROCEPHIN) 1 g in sodium chloride 0.9 % 100 mL IVPB        1 g 200 mL/hr over 30 Minutes Intravenous  Once 06/26/20 2045 06/26/20 2302   06/26/20 2100  azithromycin (ZITHROMAX) 500 mg in sodium chloride 0.9 % 250 mL IVPB        500 mg 250 mL/hr over 60 Minutes Intravenous  Once 06/26/20 2045 06/27/20 0107      Subjective/Interval History: Patient mentions that he is feeling slightly better today.  Less short of breath.  Continues to have a cough.  Noticing some blood-tinged sputum which is yellow in color.  Patient's fianc is at the bedside.  Patient denies any chest pain.  No nausea vomiting.      Assessment/Plan:  Community-acquired pneumonia/sepsis present on admission/acute respiratory failure with hypoxia Patient had leukocytosis fever tachycardia.  Found to have pneumonia in ED right  lung.  Patient started on ceftriaxone and azithromycin.   Saturations were in the high 80s and he was placed on oxygen at 2 L/min.  Currently saturating normal on room air.  Continue to monitor. COVID-19 test was negative.  HIV nonreactive.  TSH 1.36. Urine strep pneumo antigen was negative. Continue current antibiotics.  Follow-up on culture data.  WBC slightly better.  Mucinex.  Right-sided pleural effusion Likely parapneumonic.  There was some concern for some loculation.  Thoracentesis was ordered.  However did not have a good window to tap the fluid.  It was small amount to begin with.  We will treat him with antibacterials for now.  He will need repeat films in a few weeks.  Abdominal pain Possibly due to his pneumonia.  CT scan did not show any source of abdominal pain.  LFTs are unremarkable.  Lipase was normal.  Could also be due to constipation.  Supportive treatment for now.  Accelerated hypertension Patient has a history of hypertension but is untreated.  Yesterday.  Blood pressure remains poorly controlled.  Will increase the dose of amlodipine.  Continue hydralazine as needed.  May need additional agents.  Abnormal EKG ST segment changes noted on EKG in leads V1 V2.  Troponins however are normal.  EKG was repeated which showed persistent changes.   Patient without any chest discomfort.  Echocardiogram shows normal systolic function.  No regional wall motion  abnormalities were noted.  Right ventricular systolic function was normal.  No significant valvular abnormalities.  Patient needs better blood pressure control.  Outpatient referral to cardiology.  Will initiate aspirin.  Check lipid panel.  Patient's urine drug screen noted to be positive for cocaine which could have also contributed.  Normocytic anemia Mild drop in hemoglobin is dilutional.  No bleeding noted.  Stable.  Cocaine and tobacco abuse Counseled.  Obesity Estimated body mass index is 32.55 kg/m as calculated  from the following:   Height as of this encounter: 6' (1.829 m).   Weight as of this encounter: 108.9 kg.    DVT Prophylaxis: Lovenox Code Status: Full code Family Communication: Discussed with the patient Disposition Plan: Hopefully return home when improved.  Mobilize.  Status is: Inpatient  Remains inpatient appropriate because:Ongoing diagnostic testing needed not appropriate for outpatient work up, IV treatments appropriate due to intensity of illness or inability to take PO and Inpatient level of care appropriate due to severity of illness   Dispo: The patient is from: Home              Anticipated d/c is to: Home              Anticipated d/c date is: 2 days              Patient currently is not medically stable to d/c.   Difficult to place patient No      Medications:  Scheduled: . amLODipine  10 mg Oral Daily  . docusate sodium  100 mg Oral BID  . melatonin  5 mg Oral QHS  . senna  1 tablet Oral BID   Continuous: . sodium chloride 75 mL/hr at 06/28/20 0252  . azithromycin 500 mg (06/27/20 2232)  . cefTRIAXone (ROCEPHIN)  IV 1 g (06/27/20 2029)  . methocarbamol (ROBAXIN) IV Stopped (06/27/20 2956)   OZH:YQMVHQIONGEXB **OR** acetaminophen, albuterol, bisacodyl, hydrALAZINE, HYDROcodone-acetaminophen, methocarbamol (ROBAXIN) IV, morphine, ondansetron **OR** ondansetron (ZOFRAN) IV, polyethylene glycol   Objective:  Vital Signs  Vitals:   06/27/20 2050 06/27/20 2130 06/28/20 0029 06/28/20 0640  BP: (!) 191/95 (!) 174/77 (!) 166/88 (!) 171/83  Pulse:   (!) 109 (!) 103  Resp:   20 19  Temp:   99.5 F (37.5 C) 100.1 F (37.8 C)  TempSrc:   Oral Oral  SpO2:   92% 90%  Weight:      Height:        Intake/Output Summary (Last 24 hours) at 06/28/2020 0902 Last data filed at 06/28/2020 0641 Gross per 24 hour  Intake 2081.55 ml  Output 2190 ml  Net -108.45 ml   Filed Weights   06/27/20 0503  Weight: 108.9 kg    General appearance: Awake alert.  In no  distress Resp: Diminished air entry right base with few crackles.  No wheezing or rhonchi. Cardio: S1-S2 is normal regular.  No S3-S4.  No rubs murmurs or bruit GI: Abdomen is soft.  Nontender nondistended.  Bowel sounds are present normal.  No masses organomegaly Extremities: No edema.  Full range of motion of lower extremities. Neurologic: Alert and oriented x3.  No focal neurological deficits.    Lab Results:  Data Reviewed: I have personally reviewed following labs and imaging studies  CBC: Recent Labs  Lab 06/26/20 1212 06/26/20 2100 06/27/20 0000 06/28/20 0010  WBC 13.2* 16.0* 16.4* 15.7*  NEUTROABS  --  13.2* 12.4*  --   HGB 13.0 13.3 12.7* 12.5*  HCT 39.7 42.6  39.1 38.4*  MCV 85.4 86.2 85.9 84.2  PLT 295 298 295 323    Basic Metabolic Panel: Recent Labs  Lab 06/26/20 1212 06/27/20 0000 06/28/20 0010  NA 137 136 136  K 3.8 4.0 3.9  CL 103 102 102  CO2 25 23 23   GLUCOSE 123* 141* 135*  BUN 10 7 6   CREATININE 1.20 1.00 0.93  CALCIUM 9.2 8.6* 8.7*  MG  --  1.9  --   PHOS  --  3.5  --     GFR: Estimated Creatinine Clearance: 119.8 mL/min (by C-G formula based on SCr of 0.93 mg/dL).  Liver Function Tests: Recent Labs  Lab 06/26/20 1212 06/27/20 0000 06/28/20 0010  AST 20 20 19   ALT 58* 53* 39  ALKPHOS 91 98 110  BILITOT 1.4* 1.1 1.8*  PROT 7.2 7.2 6.7  ALBUMIN 3.5 3.3* 3.0*    Recent Labs  Lab 06/26/20 1212  LIPASE 26    Cardiac Enzymes: Recent Labs  Lab 06/27/20 0000  CKTOTAL 122     Lipid Profile: Recent Labs    06/26/20 2100  TRIG 41    Thyroid Function Tests: Recent Labs    06/27/20 0000  TSH 1.363    Anemia Panel: Recent Labs    06/26/20 2100  FERRITIN 292    Recent Results (from the past 240 hour(s))  Culture, blood (Routine X 2) w Reflex to ID Panel     Status: None (Preliminary result)   Collection Time: 06/26/20  3:25 PM   Specimen: BLOOD  Result Value Ref Range Status   Specimen Description BLOOD SITE  NOT SPECIFIED  Final   Special Requests   Final    BOTTLES DRAWN AEROBIC AND ANAEROBIC Blood Culture results may not be optimal due to an excessive volume of blood received in culture bottles   Culture   Final    NO GROWTH < 24 HOURS Performed at [facility], [address] [place], [facility] [address]    Report Status PENDING  Incomplete  SARS CORONAVIRUS 2 (TAT 6-24 HRS) Nasopharyngeal Nasopharyngeal Swab     Status: None   Collection Time: 06/26/20  9:00 PM   Specimen: Nasopharyngeal Swab  Result Value Ref Range Status   SARS Coronavirus 2 NEGATIVE NEGATIVE Final    Comment: (NOTE) SARS-CoV-2 target nucleic acids are NOT DETECTED.  The SARS-CoV-2 RNA is generally detectable in upper and lower respiratory specimens during the acute phase of infection. Negative results do not preclude SARS-CoV-2 infection, do not rule out co-infections with other pathogens, and should not be used as the sole basis for treatment or other patient management decisions. Negative results must be combined with clinical observations, patient history, and epidemiological information. The expected result is Negative.  Fact Sheet for Patients: [place]  Fact Sheet for Healthcare Providers: 34742  This test is not yet approved or cleared by the 06/28/20 FDA and  has been authorized for detection and/or diagnosis of SARS-CoV-2 by FDA under an Emergency Use Authorization (EUA). This EUA will remain  in effect (meaning this test can be used) for the duration of the COVID-19 declaration under Se ction 564(b)(1) of the Act, 21 U.S.C. section 360bbb-3(b)(1), unless the authorization is terminated or revoked sooner.  Performed at [facility], [address] [place], [facility] [address]   Culture, sputum-assessment     Status: None   Collection Time: 06/26/20  9:32 PM   Specimen: Expectorated Sputum  Result Value Ref  Range  Status   Specimen Description EXPECTORATED SPUTUM  Final   Special Requests NONE  Final   Sputum evaluation   Final    THIS SPECIMEN IS ACCEPTABLE FOR SPUTUM CULTURE Performed at [facility], [address], [place], [place] [address]    Report Status 06/27/2020 FINAL  Final  Culture, Respiratory w Gram Stain     Status: None (Preliminary result)   Collection Time: 06/26/20  9:32 PM  Result Value Ref Range Status   Specimen Description EXPECTORATED SPUTUM  Final   Special Requests NONE Reflexed from [id number]  Final   Gram Stain   Final    RARE WBC PRESENT, PREDOMINANTLY PMN MODERATE GRAM POSITIVE COCCI FEW GRAM NEGATIVE RODS RARE GRAM POSITIVE RODS Performed at [facility], [address] [place], [place] [address]    Culture PENDING  Incomplete   Report Status PENDING  Incomplete      Radiology Studies: DG Chest 2 View  Result Date: 06/26/2020 CLINICAL DATA:  Shortness of breath and abdominal pain. EXAM: CHEST - 2 VIEW COMPARISON:  None. FINDINGS: 2022 hours. Low volumes. The cardio pericardial silhouette is enlarged. Right base collapse/consolidation with small right pleural effusion. Retrocardiac collapse/consolidative opacity noted. The visualized bony structures of the thorax show no acute abnormality. Telemetry leads overlie the chest. IMPRESSION: Low volume film with bibasilar collapse/consolidation and small right pleural effusion. Electronically Signed   By: Kennith Center M.D.   On: 06/26/2020 20:31   CT Abdomen Pelvis W Contrast  Result Date: 06/26/2020 CLINICAL DATA:  RLQ abdominal pain, appendicitis suspected (Age >= 14y) Lower abdominal pain. EXAM: CT ABDOMEN AND PELVIS WITH CONTRAST TECHNIQUE: Multidetector CT imaging of the abdomen and pelvis was performed using the standard protocol following bolus administration of intravenous contrast. CONTRAST:  OMNIPAQUE IOHEXOL 300 MG/ML  SOLN COMPARISON:  None. FINDINGS: Lower chest: Small right pleural  effusion that is partially loculated, with fluid tracking into the minor fissure. Heterogeneous right basilar consolidation including at 2.6 cm rounded low-density component dependently, series 3, image 14. Basilar emphysema suspected, breathing motion limits detailed assessment. Subsegmental atelectasis in the left lower lobe. Heart is upper normal in size. Hepatobiliary: No focal hepatic abnormality or focal lesion. Gallbladder is decompressed. There is a Phrygian cap. No calcified gallstone or pericholecystic fat stranding. No biliary dilatation. Pancreas: No ductal dilatation or inflammation. No evidence of pancreatic mass. Spleen: Normal in size without focal abnormality. Adrenals/Urinary Tract: Diffuse left adrenal thickening without dominant nodule. Normal right adrenal gland. No hydronephrosis or perinephric edema. There are parapelvic cysts in the upper left kidney. Tiny right kidney cortical hypodensity medially is too small to characterize. A least 4 nonobstructing stones in the mid and lower left kidney. Homogeneous renal enhancement with symmetric excretion on delayed phase imaging. Urinary bladder is completely empty and not well assessed. Stomach/Bowel: Unremarkable stomach. Normal positioning of the duodenum and ligament of Treitz. Fluid within nondilated small bowel in the left abdomen without wall thickening or inflammation. No evidence of obstruction. Normal appendix, for example series 3, image 69. Moderate stool in the ascending colon. Air-filled transverse colon. Descending colon is decompressed. Sigmoid colon is redundant. No colonic wall thickening, inflammation, or evidence of mass. No significant diverticular disease. Vascular/Lymphatic: Aortic atherosclerosis. No aortic aneurysm. Circumaortic left renal vein. There is a 12 mm portal caval nodes that is nonspecific, series 3, image 35. no pelvic adenopathy. Reproductive: Prostate is unremarkable. Other: No free air, free fluid, or  intra-abdominal fluid collection. Small fat containing  umbilical hernia. Musculoskeletal: There are no acute or suspicious osseous abnormalities. No focal bone lesion. IMPRESSION: 1. Normal appendix. 2. Small right pleural effusion that is partially loculated. Heterogeneous right basilar consolidation including at 2.6 cm rounded low-density component dependently. Differential considerations include pneumonia or neoplasm with surrounding atelectasis. Recommend close clinical follow-up and short interval follow-up CT after a course of treatment. Chest radiograph could be obtained for baseline. 3. Nonobstructing left renal stones. Parapelvic cysts in the upper left kidney. 4. Nonspecific 12 mm portal caval lymph node, likely reactive. 5. Possible emphysema in the lung bases, obscured by breathing motion artifact. Aortic Atherosclerosis (ICD10-I70.0) . Electronically Signed   By: Narda RutherfordMelanie  Sanford M.D.   On: 06/26/2020 19:52   ECHOCARDIOGRAM COMPLETE  Result Date: 06/27/2020    ECHOCARDIOGRAM REPORT   Patient Name:   Billey GoslingCHARLIE Storck Date of Exam: 06/27/2020 Medical Rec #:  [id number]    Height:       72.0 in Accession #:    [id number]   Weight:       240.0 lb Date of Birth:  Mar 23, 1971    BSA:          2.302 m Patient Age:    49 years     BP:           157/81 mmHg Patient Gender: M            HR:           100 bpm. Exam Location:  Inpatient Procedure: 2D Echo, Cardiac Doppler and Color Doppler Indications:    R94.31 Abnormal EKG  History:        Patient has no prior history of Echocardiogram examinations.  Sonographer:    Elmarie Shileyiffany Dance Referring Phys: [id number] ANASTASSIA DOUTOVA IMPRESSIONS  1. Left ventricular ejection fraction, by estimation, is 55 to 60%. The left ventricle has normal function. The left ventricle has no regional wall motion abnormalities. Left ventricular diastolic parameters were normal.  2. Right ventricular systolic function is normal. The right ventricular size is normal.  3. Left atrial size was  mildly dilated.  4. The mitral valve is normal in structure. Trivial mitral valve regurgitation. No evidence of mitral stenosis.  5. The aortic valve is tricuspid. Aortic valve regurgitation is mild. No aortic stenosis is present.  6. The inferior vena cava is normal in size with greater than 50% respiratory variability, suggesting right atrial pressure of 3 mmHg. Comparison(s): No prior Echocardiogram. Conclusion(s)/Recommendation(s): Normal biventricular function without evidence of hemodynamically significant valvular heart disease. FINDINGS  Left Ventricle: Left ventricular ejection fraction, by estimation, is 55 to 60%. The left ventricle has normal function. The left ventricle has no regional wall motion abnormalities. The left ventricular internal cavity size was normal in size. There is  borderline left ventricular hypertrophy. Left ventricular diastolic parameters were normal. Right Ventricle: The right ventricular size is normal. Right vetricular wall thickness was not well visualized. Right ventricular systolic function is normal. Left Atrium: Left atrial size was mildly dilated. Right Atrium: Right atrial size was normal in size. Pericardium: There is no evidence of pericardial effusion. Mitral Valve: The mitral valve is normal in structure. Trivial mitral valve regurgitation. No evidence of mitral valve stenosis. Tricuspid Valve: The tricuspid valve is normal in structure. Tricuspid valve regurgitation is trivial. No evidence of tricuspid stenosis. Aortic Valve: The aortic valve is tricuspid. Aortic valve regurgitation is mild. No aortic stenosis is present. Pulmonic Valve: The pulmonic valve was grossly normal. Pulmonic valve regurgitation is trivial. Aorta: The  aortic root, ascending aorta, aortic arch and descending aorta are all structurally normal, with no evidence of dilitation or obstruction. Venous: The inferior vena cava is normal in size with greater than 50% respiratory variability,  suggesting right atrial pressure of 3 mmHg. IAS/Shunts: The atrial septum is grossly normal.  LEFT VENTRICLE PLAX 2D LVIDd:         5.10 cm  Diastology LVIDs:         3.80 cm  LV e' medial:    9.14 cm/s LV PW:         1.30 cm  LV E/e' medial:  9.2 LV IVS:        1.00 cm  LV e' lateral:   10.40 cm/s LVOT diam:     2.00 cm  LV E/e' lateral: 8.0 LV SV:         51 LV SV Index:   22 LVOT Area:     3.14 cm  RIGHT VENTRICLE             IVC RV Basal diam:  2.70 cm     IVC diam: 2.00 cm RV S prime:     18.60 cm/s TAPSE (M-mode): 2.9 cm LEFT ATRIUM              Index       RIGHT ATRIUM           Index LA diam:        4.60 cm  2.00 cm/m  RA Area:     14.00 cm LA Vol (A2C):   100.0 ml 43.44 ml/m RA Volume:   30.90 ml  13.42 ml/m LA Vol (A4C):   36.6 ml  15.90 ml/m LA Biplane Vol: 61.3 ml  26.63 ml/m  AORTIC VALVE LVOT Vmax:   87.40 cm/s LVOT Vmean:  64.800 cm/s LVOT VTI:    0.161 m  AORTA Ao Root diam: 2.90 cm Ao Asc diam:  3.40 cm MITRAL VALVE MV Area (PHT): 3.53 cm    SHUNTS MV Decel Time: 215 msec    Systemic VTI:  0.16 m MV E velocity: 83.70 cm/s  Systemic Diam: 2.00 cm MV A velocity: 92.30 cm/s MV E/A ratio:  0.91 Jodelle Red MD Electronically signed by Jodelle Red MD Signature Date/Time: 06/27/2020/9:41:29 PM    Final    IR US CHEST  Result Date: 06/27/2020 CLINICAL DATA:  50 year old gentleman presented to interventional radiology for right thoracentesis. EXAM: CHEST ULTRASOUND COMPARISON:  None. FINDINGS: Trace right pleural effusion is is insufficient for aspiration. IMPRESSION: Trace right pleural effusion is is insufficient for aspiration. Electronically Signed   By: Acquanetta Belling M.D.   On: 06/27/2020 10:54   VAS [place] LOWER EXTREMITY VENOUS (DVT)  Result Date: 06/27/2020  Lower Venous DVT Study Indications: Edema.  Comparison Study: No prior study on file Performing Technologist: Sherren Kerns RVS  Examination Guidelines: A complete evaluation includes B-mode imaging, spectral  Doppler, color Doppler, and power Doppler as needed of all accessible portions of each vessel. Bilateral testing is considered an integral part of a complete examination. Limited examinations for reoccurring indications may be performed as noted. The reflux portion of the exam is performed with the patient in reverse Trendelenburg.  +---------+---------------+---------+-----------+----------+--------------+ RIGHT    CompressibilityPhasicitySpontaneityPropertiesThrombus Aging +---------+---------------+---------+-----------+----------+--------------+ CFV      Full           Yes      Yes                                 +---------+---------------+---------+-----------+----------+--------------+  SFJ      Full                                                        +---------+---------------+---------+-----------+----------+--------------+ FV Prox  Full                                                        +---------+---------------+---------+-----------+----------+--------------+ FV Mid   Full                                                        +---------+---------------+---------+-----------+----------+--------------+ FV DistalFull                                                        +---------+---------------+---------+-----------+----------+--------------+ PFV      Full                                                        +---------+---------------+---------+-----------+----------+--------------+ POP      Full           Yes      Yes                                 +---------+---------------+---------+-----------+----------+--------------+ PTV      Full                                                        +---------+---------------+---------+-----------+----------+--------------+ PERO     Full                                                        +---------+---------------+---------+-----------+----------+--------------+    +---------+---------------+---------+-----------+----------+--------------+ LEFT     CompressibilityPhasicitySpontaneityPropertiesThrombus Aging +---------+---------------+---------+-----------+----------+--------------+ CFV      Full           Yes      Yes                                 +---------+---------------+---------+-----------+----------+--------------+ SFJ      Full                                                        +---------+---------------+---------+-----------+----------+--------------+  FV Prox  Full                                                        +---------+---------------+---------+-----------+----------+--------------+ FV Mid   Full                                                        +---------+---------------+---------+-----------+----------+--------------+ FV DistalFull                                                        +---------+---------------+---------+-----------+----------+--------------+ PFV      Full                                                        +---------+---------------+---------+-----------+----------+--------------+ POP      Full           Yes      Yes                                 +---------+---------------+---------+-----------+----------+--------------+ PTV      Full                                                        +---------+---------------+---------+-----------+----------+--------------+ PERO     Full                                                        +---------+---------------+---------+-----------+----------+--------------+     Summary: BILATERAL: - No evidence of deep vein thrombosis seen in the lower extremities, bilaterally. -   *See table(s) above for measurements and observations. Electronically signed by Sherald Hess MD on 06/27/2020 at 4:33:19 PM.    Final        LOS: 2 days   Osvaldo Shipper  [facility] Pager on [email or web address]  06/28/2020,  9:02 AM

## 2020-06-29 DIAGNOSIS — J189 Pneumonia, unspecified organism: Secondary | ICD-10-CM | POA: Diagnosis not present

## 2020-06-29 DIAGNOSIS — K5900 Constipation, unspecified: Secondary | ICD-10-CM | POA: Diagnosis not present

## 2020-06-29 DIAGNOSIS — R9431 Abnormal electrocardiogram [ECG] [EKG]: Secondary | ICD-10-CM | POA: Diagnosis not present

## 2020-06-29 DIAGNOSIS — I10 Essential (primary) hypertension: Secondary | ICD-10-CM | POA: Diagnosis not present

## 2020-06-29 LAB — COMPREHENSIVE METABOLIC PANEL
ALT: 30 U/L (ref 0–44)
AST: 12 U/L — ABNORMAL LOW (ref 15–41)
Albumin: 2.8 g/dL — ABNORMAL LOW (ref 3.5–5.0)
Alkaline Phosphatase: 108 U/L (ref 38–126)
Anion gap: 13 (ref 5–15)
BUN: 7 mg/dL (ref 6–20)
CO2: 21 mmol/L — ABNORMAL LOW (ref 22–32)
Calcium: 8.7 mg/dL — ABNORMAL LOW (ref 8.9–10.3)
Chloride: 105 mmol/L (ref 98–111)
Creatinine, Ser: 0.92 mg/dL (ref 0.61–1.24)
GFR, Estimated: >60 mL/min (ref >60)
Glucose, Bld: 134 mg/dL — ABNORMAL HIGH (ref 70–99)
Potassium: 3.7 mmol/L (ref 3.5–5.1)
Sodium: 139 mmol/L (ref 135–145)
Total Bilirubin: 1 mg/dL (ref 0.3–1.2)
Total Protein: 7.3 g/dL (ref 6.5–8.1)

## 2020-06-29 LAB — CBC
HCT: 38.5 % — ABNORMAL LOW (ref 39.0–52.0)
Hemoglobin: 12.4 g/dL — ABNORMAL LOW (ref 13.0–17.0)
MCH: 26.8 pg (ref 26.0–34.0)
MCHC: 32.2 g/dL (ref 30.0–36.0)
MCV: 83.3 fL (ref 80.0–100.0)
Platelets: 418 10*3/uL — ABNORMAL HIGH (ref 150–400)
RBC: 4.62 MIL/uL (ref 4.22–5.81)
RDW: 14.3 % (ref 11.5–15.5)
WBC: 15.3 10*3/uL — ABNORMAL HIGH (ref 4.0–10.5)
nRBC: 0 % (ref 0.0–0.2)

## 2020-06-29 LAB — CULTURE, RESPIRATORY W GRAM STAIN: Culture: NORMAL

## 2020-06-29 LAB — LIPID PANEL
Cholesterol: 145 mg/dL (ref 0–200)
HDL: 32 mg/dL — ABNORMAL LOW (ref >40)
LDL Cholesterol: 100 mg/dL — ABNORMAL HIGH (ref 0–99)
Total CHOL/HDL Ratio: 4.5 RATIO
Triglycerides: 64 mg/dL (ref <150)
VLDL: 13 mg/dL (ref 0–40)

## 2020-06-29 MED ORDER — POLYETHYLENE GLYCOL 3350 17 G PO PACK
17.0000 g | PACK | Freq: Two times a day (BID) | ORAL | Status: DC
  Administered 2020-06-29 – 2020-06-30 (×2): 17 g via ORAL
  Filled 2020-06-29 (×2): qty 1

## 2020-06-29 NOTE — Progress Notes (Signed)
SATURATION QUALIFICATIONS: (This note is used to comply with regulatory documentation for home oxygen)  Patient Saturations on Room Air at Rest = 93%  Patient Saturations on Room Air while Ambulating = 90%   

## 2020-06-29 NOTE — Progress Notes (Signed)
[facility] PROGRESS NOTE   Treyshaun Keatts KGM:[id number] DOB: 05/12/1970 DOA: 06/26/2020  PCP: Pcp, No  Brief History/Interval Summary: 50 year old African-American male with a past medical history of essential hypertension presented with complains of abdominal pain and bloating.  Also noted to have a dry cough for the past few days.  Reported being positive for COVID-19 about a month ago.  He however did not have any symptoms at that time.  CT scan raise concern for pneumonia.  Hospitalized for further management.  Consultants: None  Procedures:  Thoracentesis was attempted however not successful due to small volume of effusion Transthoracic echocardiogram  Antibiotics: Anti-infectives (From admission, onward)   Start     Dose/Rate Route Frequency Ordered Stop   06/27/20 2300  azithromycin (ZITHROMAX) 500 mg in sodium chloride 0.9 % 250 mL IVPB        500 mg 250 mL/hr over 60 Minutes Intravenous Every 24 hours 06/26/20 2304     06/27/20 2100  cefTRIAXone (ROCEPHIN) 1 g in sodium chloride 0.9 % 100 mL IVPB        1 g 200 mL/hr over 30 Minutes Intravenous Every 24 hours 06/26/20 2304     06/26/20 2100  cefTRIAXone (ROCEPHIN) 1 g in sodium chloride 0.9 % 100 mL IVPB        1 g 200 mL/hr over 30 Minutes Intravenous  Once 06/26/20 2045 06/26/20 2302   06/26/20 2100  azithromycin (ZITHROMAX) 500 mg in sodium chloride 0.9 % 250 mL IVPB        500 mg 250 mL/hr over 60 Minutes Intravenous  Once 06/26/20 2045 06/27/20 0107      Subjective/Interval History: Patient mentions that he is feeling slightly better.  Still feels very weak and fatigued.  Continues to have some chest pain whenever he coughs.  No further blood in sputum.  Has not really ambulated any yet.  Has not had a bowel movement yet.    Assessment/Plan:  Community-acquired pneumonia/sepsis present on admission/acute respiratory failure with hypoxia Patient had leukocytosis fever tachycardia.  Found to have  pneumonia in ED right lung.  Patient started on ceftriaxone and azithromycin.   Saturations were in the high 80s and he was placed on oxygen at 2 L/min.  Patient was weaned off of oxygen.  Seems to be saturating in the early 90s on room air. COVID-19 test was negative.  HIV nonreactive.  TSH 1.36. Urine strep pneumo antigen was negative. Fever curve appears to be improving.  Continue ceftriaxone and azithromycin for now.  Follow-up on culture data.  WBC is stable.  Procalcitonin 0.28.  Lactic acid level normal at 1.2 when last checked.  Continue Mucinex.  Flutter valve.   Right-sided pleural effusion Likely parapneumonic.  There was some concern for some loculation.  Thoracentesis was ordered.  However did not have a good window to tap the fluid.  Plus the effusion was small in amount to begin with. We will treat him with antibacterials for now.  He will need repeat films in a few weeks.  May need to repeat films earlier if there is no clinical improvement or if he feels worse.  Abdominal pain/constipation Possibly due to his pneumonia.  CT scan did not show any source of abdominal pain.  LFTs are unremarkable.  Lipase was normal.  Could also be due to constipation.  Continue with laxatives.  Supportive treatment for now.  TSH was normal.  Accelerated hypertension Patient has a history of hypertension but is untreated.  Blood  pressure was noted to be quite elevated so amlodipine was initiated.  Dose of amlodipine was increased yesterday.  Blood pressure slightly better but still remains elevated.  Will see the trends over the next 24 hours before deciding to add additional agents.  Continue hydralazine as needed.  Abnormal EKG ST segment changes noted on EKG in leads V1 V2.  Troponins however are normal.  EKG was repeated which showed persistent changes.   Echocardiogram shows normal systolic function.  No regional wall motion abnormalities were noted.  Right ventricular systolic function was  normal.  No significant valvular abnormalities.   Denies any chest pain.  Blood pressure needs to be better controlled.  Continue aspirin.  LDL to be 100 and HDL to be 32.  Will discuss management options with the patient.   Patient's urine drug screen noted to be positive for cocaine which could have also contributed.  Normocytic anemia Mild drop in hemoglobin is dilutional.  No bleeding noted.  Stable.  Cocaine and tobacco abuse Counseled.  Obesity Estimated body mass index is 32.55 kg/m as calculated from the following:   Height as of this encounter: 6' (1.829 m).   Weight as of this encounter: 108.9 kg.    DVT Prophylaxis: Lovenox Code Status: Full code Family Communication: Discussed with the patient Disposition Plan: Hopefully return home when improved.  Importance of mobility discussed with patient.  Status is: Inpatient  Remains inpatient appropriate because:Ongoing diagnostic testing needed not appropriate for outpatient work up, IV treatments appropriate due to intensity of illness or inability to take PO and Inpatient level of care appropriate due to severity of illness   Dispo: The patient is from: Home              Anticipated d/c is to: Home              Anticipated d/c date is: 2 days              Patient currently is not medically stable to d/c.   Difficult to place patient No      Medications:  Scheduled: . amLODipine  10 mg Oral Daily  . aspirin EC  81 mg Oral Daily  . docusate sodium  100 mg Oral BID  . guaiFENesin  600 mg Oral BID  . melatonin  5 mg Oral QHS  . senna  1 tablet Oral BID   Continuous: . azithromycin 500 mg (06/28/20 2236)  . cefTRIAXone (ROCEPHIN)  IV 1 g (06/28/20 2053)  . methocarbamol (ROBAXIN) IV Stopped (06/27/20 1610)   RUE:[id number] **OR** acetaminophen, albuterol, bisacodyl, diphenhydrAMINE, hydrALAZINE, HYDROcodone-acetaminophen, methocarbamol (ROBAXIN) IV, morphine, ondansetron **OR** ondansetron (ZOFRAN) IV,  polyethylene glycol   Objective:  Vital Signs  Vitals:   06/29/20 0000 06/29/20 0100 06/29/20 0409 06/29/20 0841  BP: (!) 153/90  (!) 166/88 (!) 162/82  Pulse: (!) 103  (!) 105 (!) 105  Resp: Temp: (!) 100.9 F (38.3 C) 98.9 F (37.2 C) 98.4 F (36.9 C) 99.4 F (37.4 C)  TempSrc: Oral  Oral Oral  SpO2: 91%  92% 91%  Weight:      Height:        Intake/Output Summary (Last 24 hours) at 06/29/2020 0933 Last data filed at 06/29/2020 0611 Gross per 24 hour  Intake 480 ml  Output 900 ml  Net -420 ml   Filed Weights   06/27/20 0503  Weight: 108.9 kg    General appearance: Awake alert.  In no distress  Resp: Mildly tachypneic at rest.  Crackles right base.  Occasional wheezing.  No rhonchi. Cardio: S1-S2 is normal regular.  No S3-S4.  No rubs murmurs or bruit GI: Abdomen is soft.  Mildly tender diffusely without any rebound rigidity or guarding.  No masses organomegaly.  Extremities: No edema.  Full range of motion of lower extremities. Neurologic: Alert and oriented x3.  No focal neurological deficits.    Lab Results:  Data Reviewed: I have personally reviewed following labs and imaging studies  CBC: Recent Labs  Lab 06/26/20 1212 06/26/20 2100 06/27/20 0000 06/28/20 0010 06/29/20 0246  WBC 13.2* 16.0* 16.4* 15.7* 15.3*  NEUTROABS  --  13.2* 12.4*  --   --   HGB 13.0 13.3 12.7* 12.5* 12.4*  HCT 39.7 42.6 39.1 38.4* 38.5*  MCV 85.4 86.2 85.9 84.2 83.3  PLT 295 298 295 323 418*    Basic Metabolic Panel: Recent Labs  Lab 06/26/20 1212 06/27/20 0000 06/28/20 0010 06/29/20 0246  NA 137 136 136 139  K 3.8 4.0 3.9 3.7  CL 103 102 102 105  CO2 25 23 23  21*  GLUCOSE 123* 141* 135* 134*  BUN 10 7 6 7   CREATININE 1.20 1.00 0.93 0.92  CALCIUM 9.2 8.6* 8.7* 8.7*  MG  --  1.9  --   --   PHOS  --  3.5  --   --     GFR: Estimated Creatinine Clearance: 121.1 mL/min (by C-G formula based on SCr of 0.92 mg/dL).  Liver Function Tests: Recent Labs   Lab 06/26/20 1212 06/27/20 0000 06/28/20 0010 06/29/20 0246  AST 20 20 19  12*  ALT 58* 53* 39 30  ALKPHOS 91 98 110 108  BILITOT 1.4* 1.1 1.8* 1.0  PROT 7.2 7.2 6.7 7.3  ALBUMIN 3.5 3.3* 3.0* 2.8*    Recent Labs  Lab 06/26/20 1212  LIPASE 26    Cardiac Enzymes: Recent Labs  Lab 06/27/20 0000  CKTOTAL 122     Lipid Profile: Recent Labs    06/26/20 2100 06/29/20 0246  CHOL  --  145  HDL  --  32*  LDLCALC  --  100*  TRIG 41 64  CHOLHDL  --  4.5    Thyroid Function Tests: Recent Labs    06/27/20 0000  TSH 1.363    Anemia Panel: Recent Labs    06/26/20 2100  FERRITIN 292    Recent Results (from the past 240 hour(s))  Culture, blood (Routine X 2) w Reflex to ID Panel     Status: None (Preliminary result)   Collection Time: 06/26/20  3:25 PM   Specimen: BLOOD  Result Value Ref Range Status   Specimen Description BLOOD SITE NOT SPECIFIED  Final   Special Requests   Final    BOTTLES DRAWN AEROBIC AND ANAEROBIC Blood Culture results may not be optimal due to an excessive volume of blood received in culture bottles   Culture   Final    NO GROWTH 2 DAYS Performed at [facility], [address], [place], [facility] [address]    Report Status PENDING  Incomplete  SARS CORONAVIRUS 2 (TAT 6-24 HRS) Nasopharyngeal Nasopharyngeal Swab     Status: None   Collection Time: 06/26/20  9:00 PM   Specimen: Nasopharyngeal Swab  Result Value Ref Range Status   SARS Coronavirus 2 NEGATIVE NEGATIVE Final    Comment: (NOTE) SARS-CoV-2 target nucleic acids are NOT DETECTED.  The SARS-CoV-2 RNA is generally detectable in upper and  lower respiratory specimens during the acute phase of infection. Negative results do not preclude SARS-CoV-2 infection, do not rule out co-infections with other pathogens, and should not be used as the sole basis for treatment or other patient management decisions. Negative results must be combined with clinical observations, patient  history, and epidemiological information. The expected result is Negative.  Fact Sheet for Patients: HairSlick.no  Fact Sheet for Healthcare Providers: quierodirigir.com  This test is not yet approved or cleared by the [place] FDA and  has been authorized for detection and/or diagnosis of SARS-CoV-2 by FDA under an Emergency Use Authorization (EUA). This EUA will remain  in effect (meaning this test can be used) for the duration of the COVID-19 declaration under Se ction 564(b)(1) of the Act, 21 U.S.C. section 360bbb-3(b)(1), unless the authorization is terminated or revoked sooner.  Performed at [facility], [address] [place], [place] [address]   Culture, sputum-assessment     Status: None   Collection Time: 06/26/20  9:32 PM   Specimen: Expectorated Sputum  Result Value Ref Range Status   Specimen Description EXPECTORATED SPUTUM  Final   Special Requests NONE  Final   Sputum evaluation   Final    THIS SPECIMEN IS ACCEPTABLE FOR SPUTUM CULTURE Performed at [facility], [address] [place], [place] [address]    Report Status 06/27/2020 FINAL  Final  Culture, Respiratory w Gram Stain     Status: None (Preliminary result)   Collection Time: 06/26/20  9:32 PM  Result Value Ref Range Status   Specimen Description EXPECTORATED SPUTUM  Final   Special Requests NONE Reflexed from [id number]  Final   Gram Stain   Final    RARE WBC PRESENT, PREDOMINANTLY PMN MODERATE GRAM POSITIVE COCCI FEW GRAM NEGATIVE RODS RARE GRAM POSITIVE RODS    Culture   Final    CULTURE REINCUBATED FOR BETTER GROWTH Performed at [facility], [address], [place], [place] [address]    Report Status PENDING  Incomplete      Radiology Studies: ECHOCARDIOGRAM COMPLETE  Result Date: 06/27/2020    ECHOCARDIOGRAM REPORT   Patient Name:   Gary Skinner Date of Exam: 06/27/2020 Medical Rec #:  [id number]    Height:        72.0 in Accession #:    [id number]   Weight:       240.0 lb Date of Birth:  Feb 05, 1971    BSA:          2.302 m Patient Age:    49 years     BP:           157/81 mmHg Patient Gender: M            HR:           100 bpm. Exam Location:  Inpatient Procedure: 2D Echo, Cardiac Doppler and Color Doppler Indications:    R94.31 Abnormal EKG  History:        Patient has no prior history of Echocardiogram examinations.  Sonographer:    Elmarie Shiley Dance Referring Phys: [id number] ANASTASSIA DOUTOVA IMPRESSIONS  1. Left ventricular ejection fraction, by estimation, is 55 to 60%. The left ventricle has normal function. The left ventricle has no regional wall motion abnormalities. Left ventricular diastolic parameters were normal.  2. Right ventricular systolic function is normal. The right ventricular size is normal.  3. Left atrial size was mildly dilated.  4. The mitral valve is normal in structure. Trivial  mitral valve regurgitation. No evidence of mitral stenosis.  5. The aortic valve is tricuspid. Aortic valve regurgitation is mild. No aortic stenosis is present.  6. The inferior vena cava is normal in size with greater than 50% respiratory variability, suggesting right atrial pressure of 3 mmHg. Comparison(s): No prior Echocardiogram. Conclusion(s)/Recommendation(s): Normal biventricular function without evidence of hemodynamically significant valvular heart disease. FINDINGS  Left Ventricle: Left ventricular ejection fraction, by estimation, is 55 to 60%. The left ventricle has normal function. The left ventricle has no regional wall motion abnormalities. The left ventricular internal cavity size was normal in size. There is  borderline left ventricular hypertrophy. Left ventricular diastolic parameters were normal. Right Ventricle: The right ventricular size is normal. Right vetricular wall thickness was not well visualized. Right ventricular systolic function is normal. Left Atrium: Left atrial size was mildly dilated. Right  Atrium: Right atrial size was normal in size. Pericardium: There is no evidence of pericardial effusion. Mitral Valve: The mitral valve is normal in structure. Trivial mitral valve regurgitation. No evidence of mitral valve stenosis. Tricuspid Valve: The tricuspid valve is normal in structure. Tricuspid valve regurgitation is trivial. No evidence of tricuspid stenosis. Aortic Valve: The aortic valve is tricuspid. Aortic valve regurgitation is mild. No aortic stenosis is present. Pulmonic Valve: The pulmonic valve was grossly normal. Pulmonic valve regurgitation is trivial. Aorta: The aortic root, ascending aorta, aortic arch and descending aorta are all structurally normal, with no evidence of dilitation or obstruction. Venous: The inferior vena cava is normal in size with greater than 50% respiratory variability, suggesting right atrial pressure of 3 mmHg. IAS/Shunts: The atrial septum is grossly normal.  LEFT VENTRICLE PLAX 2D LVIDd:         5.10 cm  Diastology LVIDs:         3.80 cm  LV e' medial:    9.14 cm/s LV PW:         1.30 cm  LV E/e' medial:  9.2 LV IVS:        1.00 cm  LV e' lateral:   10.40 cm/s LVOT diam:     2.00 cm  LV E/e' lateral: 8.0 LV SV:         51 LV SV Index:   22 LVOT Area:     3.14 cm  RIGHT VENTRICLE             IVC RV Basal diam:  2.70 cm     IVC diam: 2.00 cm RV S prime:     18.60 cm/s TAPSE (M-mode): 2.9 cm LEFT ATRIUM              Index       RIGHT ATRIUM           Index LA diam:        4.60 cm  2.00 cm/m  RA Area:     14.00 cm LA Vol (A2C):   100.0 ml 43.44 ml/m RA Volume:   30.90 ml  13.42 ml/m LA Vol (A4C):   36.6 ml  15.90 ml/m LA Biplane Vol: 61.3 ml  26.63 ml/m  AORTIC VALVE LVOT Vmax:   87.40 cm/s LVOT Vmean:  64.800 cm/s LVOT VTI:    0.161 m  AORTA Ao Root diam: 2.90 cm Ao Asc diam:  3.40 cm MITRAL VALVE MV Area (PHT): 3.53 cm    SHUNTS MV Decel Time: 215 msec    Systemic VTI:  0.16 m MV E velocity: 83.70 cm/s  Systemic Diam: 2.00  cm MV A velocity: 92.30 cm/s MV E/A  ratio:  0.91 Jodelle Red MD Electronically signed by Jodelle Red MD Signature Date/Time: 06/27/2020/9:41:29 PM    Final    IR US CHEST  Result Date: 06/27/2020 CLINICAL DATA:  50 year old gentleman presented to interventional radiology for right thoracentesis. EXAM: CHEST ULTRASOUND COMPARISON:  None. FINDINGS: Trace right pleural effusion is is insufficient for aspiration. IMPRESSION: Trace right pleural effusion is is insufficient for aspiration. Electronically Signed   By: Acquanetta Belling M.D.   On: 06/27/2020 10:54   VAS [place] LOWER EXTREMITY VENOUS (DVT)  Result Date: 06/27/2020  Lower Venous DVT Study Indications: Edema.  Comparison Study: No prior study on file Performing Technologist: Sherren Kerns RVS  Examination Guidelines: A complete evaluation includes B-mode imaging, spectral Doppler, color Doppler, and power Doppler as needed of all accessible portions of each vessel. Bilateral testing is considered an integral part of a complete examination. Limited examinations for reoccurring indications may be performed as noted. The reflux portion of the exam is performed with the patient in reverse Trendelenburg.  +---------+---------------+---------+-----------+----------+--------------+ RIGHT    CompressibilityPhasicitySpontaneityPropertiesThrombus Aging +---------+---------------+---------+-----------+----------+--------------+ CFV      Full           Yes      Yes                                 +---------+---------------+---------+-----------+----------+--------------+ SFJ      Full                                                        +---------+---------------+---------+-----------+----------+--------------+ FV Prox  Full                                                        +---------+---------------+---------+-----------+----------+--------------+ FV Mid   Full                                                         +---------+---------------+---------+-----------+----------+--------------+ FV DistalFull                                                        +---------+---------------+---------+-----------+----------+--------------+ PFV      Full                                                        +---------+---------------+---------+-----------+----------+--------------+ POP      Full           Yes      Yes                                 +---------+---------------+---------+-----------+----------+--------------+  PTV      Full                                                        +---------+---------------+---------+-----------+----------+--------------+ PERO     Full                                                        +---------+---------------+---------+-----------+----------+--------------+   +---------+---------------+---------+-----------+----------+--------------+ LEFT     CompressibilityPhasicitySpontaneityPropertiesThrombus Aging +---------+---------------+---------+-----------+----------+--------------+ CFV      Full           Yes      Yes                                 +---------+---------------+---------+-----------+----------+--------------+ SFJ      Full                                                        +---------+---------------+---------+-----------+----------+--------------+ FV Prox  Full                                                        +---------+---------------+---------+-----------+----------+--------------+ FV Mid   Full                                                        +---------+---------------+---------+-----------+----------+--------------+ FV DistalFull                                                        +---------+---------------+---------+-----------+----------+--------------+ PFV      Full                                                         +---------+---------------+---------+-----------+----------+--------------+ POP      Full           Yes      Yes                                 +---------+---------------+---------+-----------+----------+--------------+ PTV      Full                                                        +---------+---------------+---------+-----------+----------+--------------+  PERO     Full                                                        +---------+---------------+---------+-----------+----------+--------------+     Summary: BILATERAL: - No evidence of deep vein thrombosis seen in the lower extremities, bilaterally. -   *See table(s) above for measurements and observations. Electronically signed by Sherald Hess MD on 06/27/2020 at 4:33:19 PM.    Final        LOS: 3 days   Osvaldo Shipper  [facility] Pager on [email or web address]  06/29/2020, 9:33 AM

## 2020-06-30 DIAGNOSIS — J189 Pneumonia, unspecified organism: Secondary | ICD-10-CM | POA: Diagnosis not present

## 2020-06-30 LAB — COMPREHENSIVE METABOLIC PANEL
ALT: 30 U/L (ref 0–44)
AST: 19 U/L (ref 15–41)
Albumin: 2.6 g/dL — ABNORMAL LOW (ref 3.5–5.0)
Alkaline Phosphatase: 132 U/L — ABNORMAL HIGH (ref 38–126)
Anion gap: 15 (ref 5–15)
BUN: 10 mg/dL (ref 6–20)
CO2: 22 mmol/L (ref 22–32)
Calcium: 8.7 mg/dL — ABNORMAL LOW (ref 8.9–10.3)
Chloride: 99 mmol/L (ref 98–111)
Creatinine, Ser: 0.95 mg/dL (ref 0.61–1.24)
GFR, Estimated: >60 mL/min (ref >60)
Glucose, Bld: 143 mg/dL — ABNORMAL HIGH (ref 70–99)
Potassium: 3.7 mmol/L (ref 3.5–5.1)
Sodium: 136 mmol/L (ref 135–145)
Total Bilirubin: 0.9 mg/dL (ref 0.3–1.2)
Total Protein: 7 g/dL (ref 6.5–8.1)

## 2020-06-30 LAB — CBC
HCT: 37.7 % — ABNORMAL LOW (ref 39.0–52.0)
Hemoglobin: 12.8 g/dL — ABNORMAL LOW (ref 13.0–17.0)
MCH: 27.8 pg (ref 26.0–34.0)
MCHC: 34 g/dL (ref 30.0–36.0)
MCV: 82 fL (ref 80.0–100.0)
Platelets: 468 10*3/uL — ABNORMAL HIGH (ref 150–400)
RBC: 4.6 MIL/uL (ref 4.22–5.81)
RDW: 14.6 % (ref 11.5–15.5)
WBC: 12.2 10*3/uL — ABNORMAL HIGH (ref 4.0–10.5)
nRBC: 0 % (ref 0.0–0.2)

## 2020-06-30 MED ORDER — AZITHROMYCIN 500 MG PO TABS: 500.0000 mg | ORAL_TABLET | Freq: Every day | ORAL | 0 refills | Status: AC

## 2020-06-30 MED ORDER — HYDROCODONE-ACETAMINOPHEN 5-325 MG PO TABS: 1.0000 | ORAL_TABLET | Freq: Four times a day (QID) | ORAL | 0 refills | Status: DC | PRN

## 2020-06-30 MED ORDER — GUAIFENESIN ER 600 MG PO TB12
600.0000 mg | ORAL_TABLET | Freq: Two times a day (BID) | ORAL | 0 refills | Status: AC
Start: 2020-06-30 — End: ?

## 2020-06-30 MED ORDER — AZITHROMYCIN 250 MG PO TABS
500.0000 mg | ORAL_TABLET | Freq: Every day | ORAL | Status: DC
  Administered 2020-06-30: 500 mg via ORAL
  Filled 2020-06-30: qty 2

## 2020-06-30 MED ORDER — ASPIRIN 81 MG PO TBEC: 81.0000 mg | DELAYED_RELEASE_TABLET | Freq: Every day | ORAL | 1 refills | Status: AC

## 2020-06-30 MED ORDER — POLYETHYLENE GLYCOL 3350 17 G PO PACK: 17.0000 g | PACK | Freq: Two times a day (BID) | ORAL | 0 refills | Status: DC

## 2020-06-30 MED ORDER — CEFDINIR 300 MG PO CAPS
300.0000 mg | ORAL_CAPSULE | Freq: Two times a day (BID) | ORAL | Status: DC
  Administered 2020-06-30: 300 mg via ORAL
  Filled 2020-06-30 (×2): qty 1

## 2020-06-30 MED ORDER — CEFDINIR 300 MG PO CAPS: 300.0000 mg | ORAL_CAPSULE | Freq: Two times a day (BID) | ORAL | 0 refills | Status: DC

## 2020-06-30 MED ORDER — SENNA 8.6 MG PO TABS: 1.0000 | ORAL_TABLET | Freq: Two times a day (BID) | ORAL | 0 refills | Status: DC

## 2020-06-30 MED ORDER — AMLODIPINE BESYLATE 10 MG PO TABS: 10.0000 mg | ORAL_TABLET | Freq: Every day | ORAL | 1 refills | Status: DC

## 2020-06-30 NOTE — Discharge Summary (Signed)
[facility]  Physician Discharge Summary   Patient ID: Gary Skinner MRN: [id number] DOB/AGE: 1970-11-25 50 y.o.  Admit date: 06/26/2020 Discharge date: 06/30/2020  PCP: Pcp, No  DISCHARGE DIAGNOSES:  Community-acquired pneumonia Sepsis present on admission Right-sided pleural effusion likely parapneumonic Essential hypertension, uncontrolled Abnormal EKG Cocaine abuse Normocytic anemia  RECOMMENDATIONS FOR OUTPATIENT FOLLOW UP: 1. Patient is told to establish with a primary care provider 2. Referral sent to Dr. Rosemary Holms with cardiology for outpatient cardiac consultation 3. Treatment of dyslipidemia to be addressed in the outpatient setting.    Home Health: None Equipment/Devices: None  CODE STATUS: Full code  DISCHARGE CONDITION: fair  Diet recommendation: Low-sodium  INITIAL HISTORY:  50 year old African-American male with a past medical history of essential hypertension presented with complains of abdominal pain and bloating.  Also noted to have a dry cough for the past few days.  Reported being positive for COVID-19 about a month ago.  He however did not have any symptoms at that time.  CT scan raise concern for pneumonia.  Hospitalized for further management.  Consultations:  None  Procedures: Thoracentesis was attempted however not successful due to small volume of effusion  Transthoracic echocardiogram (see report below)    HOSPITAL COURSE:    Community-acquired pneumonia/sepsis present on admission/acute respiratory failure with hypoxia Patient had leukocytosis fever tachycardia.  Found to have pneumonia in right lung.  Patient started on ceftriaxone and azithromycin.   Saturations were in the high 80s and he was placed on oxygen at 2 L/min.  Patient was subsequently weaned off of oxygen.  COVID-19 test was negative.  HIV nonreactive.  TSH 1.36. Urine strep pneumo antigen was negative. Patient's fever resolved.  Cultures were negative.   Transition to oral antibiotics today.  He expressed wish to be discharged today.  He was ambulated without any significant drop in saturations.  He did not feel short of breath.  Considered okay for discharge with oral antibiotics.  Right-sided pleural effusion Likely parapneumonic.  There was some concern for some loculation.  Thoracentesis was ordered.  However did not have a good window to tap the fluid.  Plus the effusion was small in amount to begin with.  Plan is to treat with antibacterials.  He will need a repeat chest x-ray in a few weeks.  He was told to establish with a primary care provider so this can be facilitated.   Abdominal pain/constipation Possibly due to his pneumonia.  CT scan did not show any source of abdominal pain.  LFTs are unremarkable.  Lipase was normal.  Could also be due to constipation.  Continue with laxatives.  TSH was normal.  Accelerated hypertension Patient has a history of hypertension but is untreated.  Blood pressure was noted to be quite elevated so amlodipine was initiated.    Dose was increased.  Blood pressure is better controlled now.  Further management in the outpatient setting.  Abnormal EKG ST segment changes noted on EKG in leads V1 V2.  Troponins however are normal.  EKG was repeated which showed persistent changes.   Echocardiogram shows normal systolic function.  No regional wall motion abnormalities were noted.  Right ventricular systolic function was normal.  No significant valvular abnormalities.   Patient denies any chest pain.  Blood pressure better controlled.  Urine drug screen was positive for cocaine which could be contributing.  LDL was noted to be 100.  HDL 32.  Patient counseled to stop cocaine use.  Treatment of dyslipidemia to be addressed in  the outpatient setting.  Normocytic anemia Mild drop in hemoglobin is dilutional.  No bleeding noted.  Cocaine and tobacco abuse Counseled.  Obesity Estimated body mass index is  32.55 kg/m as calculated from the following:   Height as of this encounter: 6' (1.829 m).   Weight as of this encounter: 108.9 kg.   Overall stable.  Okay for discharge home today.   PERTINENT LABS:  The results of significant diagnostics from this hospitalization (including imaging, microbiology, ancillary and laboratory) are listed below for reference.    Microbiology: Recent Results (from the past 240 hour(s))  Culture, blood (Routine X 2) w Reflex to ID Panel     Status: None (Preliminary result)   Collection Time: 06/26/20  3:25 PM   Specimen: BLOOD  Result Value Ref Range Status   Specimen Description BLOOD SITE NOT SPECIFIED  Final   Special Requests   Final    BOTTLES DRAWN AEROBIC AND ANAEROBIC Blood Culture results may not be optimal due to an excessive volume of blood received in culture bottles   Culture   Final    NO GROWTH 4 DAYS Performed at [facility], [address], [place], [place] [address]    Report Status PENDING  Incomplete  SARS CORONAVIRUS 2 (TAT 6-24 HRS) Nasopharyngeal Nasopharyngeal Swab     Status: None   Collection Time: 06/26/20  9:00 PM   Specimen: Nasopharyngeal Swab  Result Value Ref Range Status   SARS Coronavirus 2 NEGATIVE NEGATIVE Final    Comment: (NOTE) SARS-CoV-2 target nucleic acids are NOT DETECTED.  The SARS-CoV-2 RNA is generally detectable in upper and lower respiratory specimens during the acute phase of infection. Negative results do not preclude SARS-CoV-2 infection, do not rule out co-infections with other pathogens, and should not be used as the sole basis for treatment or other patient management decisions. Negative results must be combined with clinical observations, patient history, and epidemiological information. The expected result is Negative.  Fact Sheet for Patients: HairSlick.no  Fact Sheet for Healthcare Providers: quierodirigir.com  This test  is not yet approved or cleared by the [place] FDA and  has been authorized for detection and/or diagnosis of SARS-CoV-2 by FDA under an Emergency Use Authorization (EUA). This EUA will remain  in effect (meaning this test can be used) for the duration of the COVID-19 declaration under Se ction 564(b)(1) of the Act, 21 U.S.C. section 360bbb-3(b)(1), unless the authorization is terminated or revoked sooner.  Performed at [facility], [address], [place], [place] [address]   Culture, sputum-assessment     Status: None   Collection Time: 06/26/20  9:32 PM   Specimen: Expectorated Sputum  Result Value Ref Range Status   Specimen Description EXPECTORATED SPUTUM  Final   Special Requests NONE  Final   Sputum evaluation   Final    THIS SPECIMEN IS ACCEPTABLE FOR SPUTUM CULTURE Performed at [facility], [address] [place], [place] [address]    Report Status 06/27/2020 FINAL  Final  Culture, Respiratory w Gram Stain     Status: None   Collection Time: 06/26/20  9:32 PM  Result Value Ref Range Status   Specimen Description EXPECTORATED SPUTUM  Final   Special Requests NONE Reflexed from [id number]  Final   Gram Stain   Final    RARE WBC PRESENT, PREDOMINANTLY PMN MODERATE GRAM POSITIVE COCCI FEW GRAM NEGATIVE RODS RARE GRAM POSITIVE RODS    Culture   Final  ABUNDANT Consistent with normal respiratory flora. No Pseudomonas species isolated Performed at [facility], [address] [place], [place] [address]    Report Status 06/29/2020 FINAL  Final     Labs:  COVID-19 Labs   Lab Results  Component Value Date   SARSCOV2NAA NEGATIVE 06/26/2020      Basic Metabolic Panel: Recent Labs  Lab 06/26/20 1212 06/27/20 0000 06/28/20 0010 06/29/20 0246 06/30/20 0225  NA 137 136 136 139 136  K 3.8 4.0 3.9 3.7 3.7  CL 103 102 102 105 99  CO2 21* 22  GLUCOSE 123* 141* 135* 134* 143*  BUN CREATININE 1.20 1.00 0.93 0.92 0.95   CALCIUM 9.2 8.6* 8.7* 8.7* 8.7*  MG  --  1.9  --   --   --   PHOS  --  3.5  --   --   --    Liver Function Tests: Recent Labs  Lab 06/26/20 1212 06/27/20 0000 06/28/20 0010 06/29/20 0246 06/30/20 0225  AST 12* 19  ALT 58* 53* 39 30 30  ALKPHOS 91 98 110 108 132*  BILITOT 1.4* 1.1 1.8* 1.0 0.9  PROT 7.2 7.2 6.7 7.3 7.0  ALBUMIN 3.5 3.3* 3.0* 2.8* 2.6*   Recent Labs  Lab 06/26/20 1212  LIPASE 26   CBC: Recent Labs  Lab 06/26/20 2100 06/27/20 0000 06/28/20 0010 06/29/20 0246 06/30/20 0225  WBC 16.0* 16.4* 15.7* 15.3* 12.2*  NEUTROABS 13.2* 12.4*  --   --   --   HGB 13.3 12.7* 12.5* 12.4* 12.8*  HCT 42.6 39.1 38.4* 38.5* 37.7*  MCV 86.2 85.9 84.2 83.3 82.0  PLT 298 295 323 418* 468*   Cardiac Enzymes: Recent Labs  Lab 06/27/20 0000  CKTOTAL 122     IMAGING STUDIES DG Chest 2 View  Result Date: 06/26/2020 CLINICAL DATA:  Shortness of breath and abdominal pain. EXAM: CHEST - 2 VIEW COMPARISON:  None. FINDINGS: 2022 hours. Low volumes. The cardio pericardial silhouette is enlarged. Right base collapse/consolidation with small right pleural effusion. Retrocardiac collapse/consolidative opacity noted. The visualized bony structures of the thorax show no acute abnormality. Telemetry leads overlie the chest. IMPRESSION: Low volume film with bibasilar collapse/consolidation and small right pleural effusion. Electronically Signed   By: Kennith Center M.D.   On: 06/26/2020 20:31   CT Abdomen Pelvis W Contrast  Result Date: 06/26/2020 CLINICAL DATA:  RLQ abdominal pain, appendicitis suspected (Age >= 14y) Lower abdominal pain. EXAM: CT ABDOMEN AND PELVIS WITH CONTRAST TECHNIQUE: Multidetector CT imaging of the abdomen and pelvis was performed using the standard protocol following bolus administration of intravenous contrast. CONTRAST:  OMNIPAQUE IOHEXOL 300 MG/ML  SOLN COMPARISON:  None. FINDINGS: Lower chest: Small right pleural effusion that is partially  loculated, with fluid tracking into the minor fissure. Heterogeneous right basilar consolidation including at 2.6 cm rounded low-density component dependently, series 3, image 14. Basilar emphysema suspected, breathing motion limits detailed assessment. Subsegmental atelectasis in the left lower lobe. Heart is upper normal in size. Hepatobiliary: No focal hepatic abnormality or focal lesion. Gallbladder is decompressed. There is a Phrygian cap. No calcified gallstone or pericholecystic fat stranding. No biliary dilatation. Pancreas: No ductal dilatation or inflammation. No evidence of pancreatic mass. Spleen: Normal in size without focal abnormality. Adrenals/Urinary Tract: Diffuse left adrenal thickening without dominant nodule. Normal right adrenal gland. No hydronephrosis or perinephric edema. There are parapelvic cysts in the upper left kidney.  Tiny right kidney cortical hypodensity medially is too small to characterize. A least 4 nonobstructing stones in the mid and lower left kidney. Homogeneous renal enhancement with symmetric excretion on delayed phase imaging. Urinary bladder is completely empty and not well assessed. Stomach/Bowel: Unremarkable stomach. Normal positioning of the duodenum and ligament of Treitz. Fluid within nondilated small bowel in the left abdomen without wall thickening or inflammation. No evidence of obstruction. Normal appendix, for example series 3, image 69. Moderate stool in the ascending colon. Air-filled transverse colon. Descending colon is decompressed. Sigmoid colon is redundant. No colonic wall thickening, inflammation, or evidence of mass. No significant diverticular disease. Vascular/Lymphatic: Aortic atherosclerosis. No aortic aneurysm. Circumaortic left renal vein. There is a 12 mm portal caval nodes that is nonspecific, series 3, image 35. no pelvic adenopathy. Reproductive: Prostate is unremarkable. Other: No free air, free fluid, or intra-abdominal fluid collection.  Small fat containing umbilical hernia. Musculoskeletal: There are no acute or suspicious osseous abnormalities. No focal bone lesion. IMPRESSION: 1. Normal appendix. 2. Small right pleural effusion that is partially loculated. Heterogeneous right basilar consolidation including at 2.6 cm rounded low-density component dependently. Differential considerations include pneumonia or neoplasm with surrounding atelectasis. Recommend close clinical follow-up and short interval follow-up CT after a course of treatment. Chest radiograph could be obtained for baseline. 3. Nonobstructing left renal stones. Parapelvic cysts in the upper left kidney. 4. Nonspecific 12 mm portal caval lymph node, likely reactive. 5. Possible emphysema in the lung bases, obscured by breathing motion artifact. Aortic Atherosclerosis (ICD10-I70.0) . Electronically Signed   By: Narda Rutherford M.D.   On: 06/26/2020 19:52   ECHOCARDIOGRAM COMPLETE  Result Date: 06/27/2020    ECHOCARDIOGRAM REPORT   Patient Name:   Gary Skinner Date of Exam: 06/27/2020 Medical Rec #:  [id number]    Height:       72.0 in Accession #:    [id number]   Weight:       240.0 lb Date of Birth:  26-Mar-1971    BSA:          2.302 m Patient Age:    49 years     BP:           157/81 mmHg Patient Gender: M            HR:           100 bpm. Exam Location:  Inpatient Procedure: 2D Echo, Cardiac Doppler and Color Doppler Indications:    R94.31 Abnormal EKG  History:        Patient has no prior history of Echocardiogram examinations.  Sonographer:    Elmarie Shiley Dance Referring Phys: [id number] ANASTASSIA DOUTOVA IMPRESSIONS  1. Left ventricular ejection fraction, by estimation, is 55 to 60%. The left ventricle has normal function. The left ventricle has no regional wall motion abnormalities. Left ventricular diastolic parameters were normal.  2. Right ventricular systolic function is normal. The right ventricular size is normal.  3. Left atrial size was mildly dilated.  4. The mitral valve is  normal in structure. Trivial mitral valve regurgitation. No evidence of mitral stenosis.  5. The aortic valve is tricuspid. Aortic valve regurgitation is mild. No aortic stenosis is present.  6. The inferior vena cava is normal in size with greater than 50% respiratory variability, suggesting right atrial pressure of 3 mmHg. Comparison(s): No prior Echocardiogram. Conclusion(s)/Recommendation(s): Normal biventricular function without evidence of hemodynamically significant valvular heart disease. FINDINGS  Left Ventricle: Left ventricular ejection fraction, by estimation, is 55 to  60%. The left ventricle has normal function. The left ventricle has no regional wall motion abnormalities. The left ventricular internal cavity size was normal in size. There is  borderline left ventricular hypertrophy. Left ventricular diastolic parameters were normal. Right Ventricle: The right ventricular size is normal. Right vetricular wall thickness was not well visualized. Right ventricular systolic function is normal. Left Atrium: Left atrial size was mildly dilated. Right Atrium: Right atrial size was normal in size. Pericardium: There is no evidence of pericardial effusion. Mitral Valve: The mitral valve is normal in structure. Trivial mitral valve regurgitation. No evidence of mitral valve stenosis. Tricuspid Valve: The tricuspid valve is normal in structure. Tricuspid valve regurgitation is trivial. No evidence of tricuspid stenosis. Aortic Valve: The aortic valve is tricuspid. Aortic valve regurgitation is mild. No aortic stenosis is present. Pulmonic Valve: The pulmonic valve was grossly normal. Pulmonic valve regurgitation is trivial. Aorta: The aortic root, ascending aorta, aortic arch and descending aorta are all structurally normal, with no evidence of dilitation or obstruction. Venous: The inferior vena cava is normal in size with greater than 50% respiratory variability, suggesting right atrial pressure of 3 mmHg.  IAS/Shunts: The atrial septum is grossly normal.  LEFT VENTRICLE PLAX 2D LVIDd:         5.10 cm  Diastology LVIDs:         3.80 cm  LV e' medial:    9.14 cm/s LV PW:         1.30 cm  LV E/e' medial:  9.2 LV IVS:        1.00 cm  LV e' lateral:   10.40 cm/s LVOT diam:     2.00 cm  LV E/e' lateral: 8.0 LV SV:         51 LV SV Index:   22 LVOT Area:     3.14 cm  RIGHT VENTRICLE             IVC RV Basal diam:  2.70 cm     IVC diam: 2.00 cm RV S prime:     18.60 cm/s TAPSE (M-mode): 2.9 cm LEFT ATRIUM              Index       RIGHT ATRIUM           Index LA diam:        4.60 cm  2.00 cm/m  RA Area:     14.00 cm LA Vol (A2C):   100.0 ml 43.44 ml/m RA Volume:   30.90 ml  13.42 ml/m LA Vol (A4C):   36.6 ml  15.90 ml/m LA Biplane Vol: 61.3 ml  26.63 ml/m  AORTIC VALVE LVOT Vmax:   87.40 cm/s LVOT Vmean:  64.800 cm/s LVOT VTI:    0.161 m  AORTA Ao Root diam: 2.90 cm Ao Asc diam:  3.40 cm MITRAL VALVE MV Area (PHT): 3.53 cm    SHUNTS MV Decel Time: 215 msec    Systemic VTI:  0.16 m MV E velocity: 83.70 cm/s  Systemic Diam: 2.00 cm MV A velocity: 92.30 cm/s MV E/A ratio:  0.91 Jodelle RedBridgette Christopher MD Electronically signed by Jodelle RedBridgette Christopher MD Signature Date/Time: 06/27/2020/9:41:29 PM    Final    IR US CHEST  Result Date: 06/27/2020 CLINICAL DATA:  50 year old gentleman presented to interventional radiology for right thoracentesis. EXAM: CHEST ULTRASOUND COMPARISON:  None. FINDINGS: Trace right pleural effusion is is insufficient for aspiration. IMPRESSION: Trace right pleural effusion is is insufficient for aspiration. Electronically  Signed   By: Acquanetta Belling M.D.   On: 06/27/2020 10:54   VAS [place] LOWER EXTREMITY VENOUS (DVT)  Result Date: 06/27/2020  Lower Venous DVT Study Indications: Edema.  Comparison Study: No prior study on file Performing Technologist: Sherren Kerns RVS  Examination Guidelines: A complete evaluation includes B-mode imaging, spectral Doppler, color Doppler, and power Doppler as  needed of all accessible portions of each vessel. Bilateral testing is considered an integral part of a complete examination. Limited examinations for reoccurring indications may be performed as noted. The reflux portion of the exam is performed with the patient in reverse Trendelenburg.  +---------+---------------+---------+-----------+----------+--------------+ RIGHT    CompressibilityPhasicitySpontaneityPropertiesThrombus Aging +---------+---------------+---------+-----------+----------+--------------+ CFV      Full           Yes      Yes                                 +---------+---------------+---------+-----------+----------+--------------+ SFJ      Full                                                        +---------+---------------+---------+-----------+----------+--------------+ FV Prox  Full                                                        +---------+---------------+---------+-----------+----------+--------------+ FV Mid   Full                                                        +---------+---------------+---------+-----------+----------+--------------+ FV DistalFull                                                        +---------+---------------+---------+-----------+----------+--------------+ PFV      Full                                                        +---------+---------------+---------+-----------+----------+--------------+ POP      Full           Yes      Yes                                 +---------+---------------+---------+-----------+----------+--------------+ PTV      Full                                                        +---------+---------------+---------+-----------+----------+--------------+ PERO  Full                                                        +---------+---------------+---------+-----------+----------+--------------+    +---------+---------------+---------+-----------+----------+--------------+ LEFT     CompressibilityPhasicitySpontaneityPropertiesThrombus Aging +---------+---------------+---------+-----------+----------+--------------+ CFV      Full           Yes      Yes                                 +---------+---------------+---------+-----------+----------+--------------+ SFJ      Full                                                        +---------+---------------+---------+-----------+----------+--------------+ FV Prox  Full                                                        +---------+---------------+---------+-----------+----------+--------------+ FV Mid   Full                                                        +---------+---------------+---------+-----------+----------+--------------+ FV DistalFull                                                        +---------+---------------+---------+-----------+----------+--------------+ PFV      Full                                                        +---------+---------------+---------+-----------+----------+--------------+ POP      Full           Yes      Yes                                 +---------+---------------+---------+-----------+----------+--------------+ PTV      Full                                                        +---------+---------------+---------+-----------+----------+--------------+ PERO     Full                                                        +---------+---------------+---------+-----------+----------+--------------+  Summary: BILATERAL: - No evidence of deep vein thrombosis seen in the lower extremities, bilaterally. -   *See table(s) above for measurements and observations. Electronically signed by Sherald Hess MD on 06/27/2020 at 4:33:19 PM.    Final     DISCHARGE EXAMINATION: Vitals:   06/29/20 1201 06/29/20 1758 06/30/20 0004 06/30/20 0421  BP:  (!) 128/58 (!) 141/78 (!) 142/78 (!) 152/84  Pulse: (!) 101 (!) 106 96 (!) 102  Resp: 16 16 18 18   Temp: 99.1 F (37.3 C) 99.4 F (37.4 C) 100.1 F (37.8 C) 99.8 F (37.7 C)  TempSrc: Oral Oral Oral Oral  SpO2: 92% 91% 93% 92%  Weight:      Height:       General appearance: Awake alert.  In no distress Resp: Improved aeration.  Few crackles in the right base.  No wheezing or rhonchi.   Cardio: S1-S2 is normal regular.  No S3-S4.  No rubs murmurs or bruit GI: Abdomen is soft.  Nontender nondistended.  Bowel sounds are present normal.  No masses organomegaly     DISPOSITION: Home  Discharge Instructions    Call MD for:  difficulty breathing, headache or visual disturbances   Complete by: As directed    Call MD for:  extreme fatigue   Complete by: As directed    Call MD for:  hives   Complete by: As directed    Call MD for:  persistant dizziness or light-headedness   Complete by: As directed    Call MD for:  persistant nausea and vomiting   Complete by: As directed    Call MD for:  severe uncontrolled pain   Complete by: As directed    Call MD for:  temperature >100.4   Complete by: As directed    Diet - low sodium heart healthy   Complete by: As directed    Discharge instructions   Complete by: As directed    Please take your medications as prescribed.  Please be sure to establish with a primary care provider.  You will need to have a chest x-ray done in 3 to 4 weeks to make sure your pneumonia has completely cleared up.  You will need to have your blood pressure checked and medication adjustment may need to be made to get your blood pressure under better control. Cardiology office will contact you for an appointment.  Please stop recreational drug use.   You were cared for by a hospitalist during your hospital stay. If you have any questions about your discharge medications or the care you received while you were in the hospital after you are discharged, you can call the  unit and asked to speak with the hospitalist on call if the hospitalist that took care of you is not available. Once you are discharged, your primary care physician will handle any further medical issues. Please note that NO REFILLS for any discharge medications will be authorized once you are discharged, as it is imperative that you return to your primary care physician (or establish a relationship with a primary care physician if you do not have one) for your aftercare needs so that they can reassess your need for medications and monitor your lab values. If you do not have a primary care physician, you can call [phone] for a physician referral.   Increase activity slowly   Complete by: As directed          Allergies as of 06/30/2020   No Known Allergies  Medication List    STOP taking these medications   naproxen sodium 220 MG tablet Commonly known as: ALEVE     TAKE these medications   acetaminophen 325 MG tablet Commonly known as: TYLENOL Take 650 mg by mouth every 6 (six) hours as needed for moderate pain or headache.   amLODipine 10 MG tablet Commonly known as: NORVASC Take 1 tablet (10 mg total) by mouth daily. Start taking on: July 01, 2020   aspirin 81 MG EC tablet Take 1 tablet (81 mg total) by mouth daily. Swallow whole. Start taking on: July 01, 2020   azithromycin 500 MG tablet Commonly known as: ZITHROMAX Take 1 tablet (500 mg total) by mouth daily for 3 days.   cefdinir 300 MG capsule Commonly known as: OMNICEF Take 1 capsule (300 mg total) by mouth 2 (two) times daily for 4 days.   guaiFENesin 600 MG 12 hr tablet Commonly known as: MUCINEX Take 1 tablet (600 mg total) by mouth 2 (two) times daily.   HYDROcodone-acetaminophen 5-325 MG tablet Commonly known as: NORCO/VICODIN Take 1 tablet by mouth every 6 (six) hours as needed for severe pain.   ibuprofen 200 MG tablet Commonly known as: ADVIL Take 400 mg by mouth every 6 (six) hours as  needed.   polyethylene glycol 17 g packet Commonly known as: MIRALAX / GLYCOLAX Take 17 g by mouth 2 (two) times daily.   senna 8.6 MG Tabs tablet Commonly known as: SENOKOT Take 1 tablet (8.6 mg total) by mouth 2 (two) times daily.   VISINE RED EYE COMFORT OP Place 1 drop into both eyes daily as needed (red dry eyes).         Follow-up Information    Patwardhan, Anabel Bene, MD Follow up.   Specialties: Cardiology, Radiology Why: his office will call to schedule appointment Contact information: [address] [place] [address] [phone]               TOTAL DISCHARGE TIME: 35 minutes  Hattye Siegfried Rito Ehrlich  [facility] Pager on [email or web address]  06/30/2020, 11:04 AM

## 2020-06-30 NOTE — Discharge Instructions (Signed)
Community-Acquired Pneumonia, Adult Pneumonia is an infection of the lungs. It causes irritation and swelling in the airways of the lungs. Mucus and fluid may also build up inside the airways. This may cause coughing and trouble breathing. One type of pneumonia can happen while you are in a hospital. A different type can happen when you are not in a hospital (community-acquired pneumonia). What are the causes? This condition is caused by germs (viruses, bacteria, or fungi). Some types of germs can spread from person to person. Pneumonia is not thought to spread from person to person.   What increases the risk? You are more likely to develop this condition if:  You have a long-term (chronic) disease, such as: ? Disease of the lungs. This may be chronic obstructive pulmonary disease (COPD) or asthma. ? Heart failure. ? Cystic fibrosis. ? Diabetes. ? Kidney disease. ? Sickle cell disease. ? HIV.  You have other health problems, such as: ? Your body's defense system (immune system) is weak. ? A condition that may cause you to breathe in fluids from your mouth and nose.  You had your spleen taken out.  You do not take good care of your teeth and mouth (poor dental hygiene).  You use or have used tobacco products.  You travel where the germs that cause this illness are common.  You are near certain animals or the places they live.  You are older than 52 years of age. What are the signs or symptoms? Symptoms of this condition include:  A cough.  A fever.  Sweating or chills.  Chest pain, often when you breathe deeply or cough.  Breathing problems, such as: ? Fast breathing. ? Trouble breathing. ? Shortness of breath.  Feeling tired (fatigued).  Muscle aches. How is this treated? Treatment for this condition depends on many things, such as:  The cause of your illness.  Your medicines.  Your other health problems. Most adults can be treated at home. Sometimes,  treatment must happen in a hospital.  Treatment may include medicines to kill germs.  Medicines may depend on which germ caused your illness. Very bad pneumonia is rare. If you get it, you may:  Have a machine to help you breathe.  Have fluid taken away from around your lungs. Follow these instructions at home: Medicines  Take over-the-counter and prescription medicines only as told by your doctor.  Take cough medicine only if you are losing sleep. Cough medicine can keep your body from taking mucus away from your lungs.  If you were prescribed an antibiotic medicine, take it as told by your doctor. Do not stop taking the antibiotic even if you start to feel better. Lifestyle  Do not drink alcohol.  Do not use any products that contain nicotine or tobacco, such as cigarettes, e-cigarettes, and chewing tobacco. If you need help quitting, ask your doctor.  Eat a healthy diet. This includes a lot of vegetables, fruits, whole grains, low-fat dairy products, and low-fat (lean) protein.      General instructions  Rest a lot. Sleep for at least 8 hours each night.  Sleep with your head and neck raised. Put a few pillows under your head or sleep in a reclining chair.  Return to your normal activities as told by your doctor. Ask your doctor what activities are safe for you.  Drink enough fluid to keep your pee (urine) pale yellow.  If your throat is sore, rinse your mouth often with salt water. To make salt   water, dissolve -1 tsp (3-6 g) of salt in 1 cup (237 mL) of warm water.  Keep all follow-up visits as told by your doctor. This is important.   How is this prevented? You can lower your risk of pneumonia by:  Getting the pneumonia shot (vaccine). These shots have different types and schedules. Ask your doctor what works best for you. Think about getting this shot if: ? You are older than 52 years of age. ? You are 19-65 years of age and:  You are being treated for  cancer.  You have long-term lung disease.  You have other problems that affect your body's defense system. Ask your doctor if you have one of these.  Getting your flu shot every year. Ask your doctor which type of shot is best for you.  Going to the dentist as often as told.  Washing your hands often with soap and water for at least 20 seconds. If you cannot use soap and water, use hand sanitizer. Contact a doctor if:  You have a fever.  You lose sleep because your cough medicine does not help. Get help right away if:  You are short of breath and this gets worse.  You have more chest pain.  Your sickness gets worse. This is very serious if: ? You are an older adult. ? Your body's defense system is weak.  You cough up blood. These symptoms may be an emergency. Do not wait to see if the symptoms will go away. Get medical help right away. Call your local emergency services (911 in the [place] Do not drive yourself to the hospital. Summary  Pneumonia is an infection of the lungs.  Community-acquired pneumonia affects people who have not been in the hospital. Certain germs can cause this infection.  This condition may be treated with medicines that kill germs.  For very bad pneumonia, you may need a hospital stay and treatment to help with breathing. This information is not intended to replace advice given to you by your health care provider. Make sure you discuss any questions you have with your health care provider. Document Revised: 02/07/2019 Document Reviewed: 02/07/2019 Elsevier Patient Education  2021 [employer] Inc.  

## 2020-06-30 NOTE — Progress Notes (Signed)
SATURATION QUALIFICATIONS: (This note is used to comply with regulatory documentation for home oxygen)  Patient Saturations on Room Air at Rest = 92%  Patient Saturations on Room Air while Ambulating = 91-94% briefly dropped to 88% NT was going to place pt on O2 but patient came back up to 93% immediately.   Patient Saturations on 0 Liters of oxygen while Ambulating = NA%  Please briefly explain why patient needs home oxygen: PT did not require any supplemental oxygen.

## 2020-07-01 LAB — CULTURE, BLOOD (ROUTINE X 2): Culture: NO GROWTH

## 2020-07-04 ENCOUNTER — Other Ambulatory Visit: Payer: Self-pay

## 2020-07-04 ENCOUNTER — Encounter: Payer: Self-pay | Admitting: Cardiology

## 2020-07-04 ENCOUNTER — Ambulatory Visit: Payer: 59 | Admitting: Cardiology

## 2020-07-04 VITALS — BP 151/90 | HR 104 | Temp 98.1°F | Ht 71.0 in | Wt 236.0 lb

## 2020-07-04 DIAGNOSIS — R9431 Abnormal electrocardiogram [ECG] [EKG]: Secondary | ICD-10-CM

## 2020-07-04 DIAGNOSIS — I10 Essential (primary) hypertension: Secondary | ICD-10-CM

## 2020-07-04 DIAGNOSIS — R079 Chest pain, unspecified: Secondary | ICD-10-CM

## 2020-07-04 MED ORDER — DILTIAZEM HCL ER COATED BEADS 240 MG PO CP24: 240.0000 mg | ORAL_CAPSULE | Freq: Every day | ORAL | 3 refills | Status: AC

## 2020-07-04 NOTE — Progress Notes (Signed)
Patient referred by No ref. provider found for abnormal EKG  Subjective:   Gary Skinner, male    DOB: Sep 14, 1970, 50 y.o.   MRN: [id number]   Chief Complaint  Patient presents with  . Hypertension  . Chest Pain  . Coronary Artery Disease  . Follow-up    Hospital      HPI  50 y.o. African  male with hypertension, h/o cocaine abuse, recent CAP in 06/2020, referred for evaluation of abnormal EKG  Patient was hospitalized in 06/2020 with complaints of cough and pleuritic chest pain. He had had COVID two weeks prior to that. He was found to have community acquired pneumonia, for which he was treated with antibiotics.Prior to this, he did not have any exertional chest pain or shortness of breath symptoms. EKG was abnormal, ad detailed below. Echocardiogram did not show any significant abnormalities. Blood pressure and heart rate elevated today.    Past Medical History:  Diagnosis Date  . Hypertension      History reviewed. No pertinent surgical history.   Social History   Tobacco Use  Smoking Status Light Tobacco Smoker  . Types: Cigars  Smokeless Tobacco Never Used    Social History   Substance and Sexual Activity  Alcohol Use Yes   Comment: occasional     Family History  Problem Relation Age of Onset  . CAD Neg Hx   . Hypertension Neg Hx   . Diabetes Neg Hx      Current Outpatient Medications on File Prior to Visit  Medication Sig Dispense Refill  . acetaminophen (TYLENOL) 325 MG tablet Take 650 mg by mouth every 6 (six) hours as needed for moderate pain or headache.    Marland Kitchen amLODipine (NORVASC) 10 MG tablet Take 1 tablet (10 mg total) by mouth daily. 30 tablet 1  . aspirin EC 81 MG EC tablet Take 1 tablet (81 mg total) by mouth daily. Swallow whole. 30 tablet 1  . cefdinir (OMNICEF) 300 MG capsule Take 1 capsule (300 mg total) by mouth 2 (two) times daily for 4 days. 8 capsule 0  . guaiFENesin (MUCINEX) 600 MG 12 hr tablet Take 1 tablet (600 mg total) by  mouth 2 (two) times daily. 30 tablet 0  . HYDROcodone-acetaminophen (NORCO/VICODIN) 5-325 MG tablet Take 1 tablet by mouth every 6 (six) hours as needed for severe pain. 15 tablet 0  . ibuprofen (ADVIL) 200 MG tablet Take 400 mg by mouth every 6 (six) hours as needed.    . polyethylene glycol (MIRALAX / GLYCOLAX) 17 g packet Take 17 g by mouth 2 (two) times daily. 14 each 0  . senna (SENOKOT) 8.6 MG TABS tablet Take 1 tablet (8.6 mg total) by mouth 2 (two) times daily. 120 tablet 0  . Tetrahydrozoline HCl (VISINE RED EYE COMFORT OP) Place 1 drop into both eyes daily as needed (red dry eyes).     No current facility-administered medications on file prior to visit.    Cardiovascular and other pertinent studies:  EKG 07/04/2020: Sinus tachycardia 104 bpm Left atrial enlargement  Old anterior infarct  Echocardiogram 06/27/2020: 1. Left ventricular ejection fraction, by estimation, is 55 to 60%. The  left ventricle has normal function. The left ventricle has no regional  wall motion abnormalities. Left ventricular diastolic parameters were  normal.  2. Right ventricular systolic function is normal. The right ventricular  size is normal.  3. Left atrial size was mildly dilated.  4. The mitral valve is normal in structure.  Trivial mitral valve  regurgitation. No evidence of mitral stenosis.  5. The aortic valve is tricuspid. Aortic valve regurgitation is mild. No  aortic stenosis is present.  6. The inferior vena cava is normal in size with greater than 50%  respiratory variability, suggesting right atrial pressure of 3 mmHg.    Recent labs: 06/30/2020: Glucose 143, BUN/Cr 10/0.95. EGFR >60. Na/K 136/3.7. Albumin 2.6. AlKP 132. Rest of the CMP normal H/H 12/37. MCV 82. Platelets 468 HbA1C N/A Chol 145, TG 64, HDL 32, LDL 100 TSH 1.3 normal    Review of Systems  Cardiovascular: Positive for chest pain (Pleuritic). Negative for dyspnea on exertion, leg swelling, palpitations and  syncope.         Vitals:   07/04/20 0904 07/04/20 0911  BP: (!) 155/90 (!) 151/90  Pulse: (!) 110 (!) 104  Temp: 98.1 F (36.7 C)      Body mass index is 32.92 kg/m. Filed Weights   07/04/20 0904  Weight: 236 lb (107 kg)     Objective:   Physical Exam Vitals and nursing note reviewed.  Constitutional:      General: He is not in acute distress. Neck:     Vascular: No JVD.  Cardiovascular:     Rate and Rhythm: Normal rate and regular rhythm.     Pulses: Normal pulses.     Heart sounds: Normal heart sounds. No murmur heard.   Pulmonary:     Effort: Pulmonary effort is normal.     Breath sounds: Normal breath sounds. No wheezing or rales.         Assessment & Recommendations:   50 y.o. African  male with hypertension, h/o cocaine abuse, recent CAP in 06/2020, referred for evaluation of abnormal EKG  Abnormal EKG: Suspicious for possible old anterior infarct. Echocardiogram does not show any significant wall motion abnormalities. He does not have any angina symptoms at this time. I will repeat EKG in 6 weeks after his recovery from pnuemonia  Hypertension: Uncontrolled. HR also elevated. Avoiding beta blockers due to h/o cocaine. Will switch amlodipine 10 mg to diltiazem 240 mg.  Repeat EKG and f/u in 6 weeks  Thank you for referring the patient to [place]. Please feel free to contact with any questions.   Nigel Mormon, MD Pager: [phone] Office: [phone]

## 2020-07-05 ENCOUNTER — Encounter: Payer: Self-pay | Admitting: Cardiology

## 2020-09-20 ENCOUNTER — Ambulatory Visit: Payer: 59 | Admitting: Cardiology

## 2022-09-26 IMAGING — CT CT ABD-PELV W/ CM
2 of 5 series · 15 of 46 positions shown, 17 images · IV contrast (APPLIED)
Comparison: None.

CLINICAL DATA: RLQ abdominal pain, appendicitis suspected (Age >=
14y)

Lower abdominal pain.
EXAM:
CT ABDOMEN AND PELVIS WITH CONTRAST
TECHNIQUE: Multidetector CT imaging of the abdomen and pelvis was performed
using the standard protocol following bolus administration of
intravenous contrast.
CONTRAST:  100mL OMNIPAQUE IOHEXOL 300 MG/ML  SOLN

[Series 3: abdomen 5.0 · axial · 0.85mm/px · z∈[+870,+1315]mm · 12 of 105 slices shown, 14 images]
[im 8/105  soft-tissue]
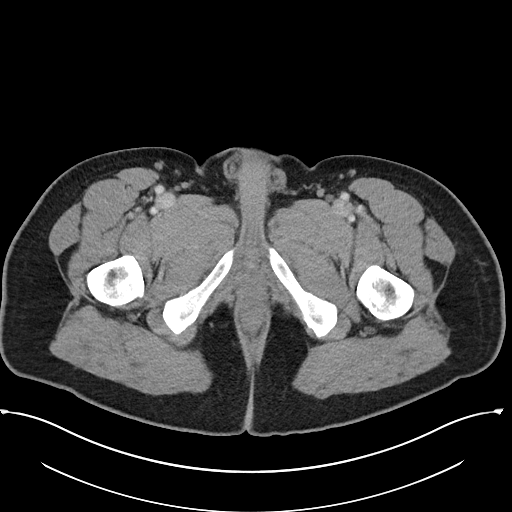
[im 8/105  bone]
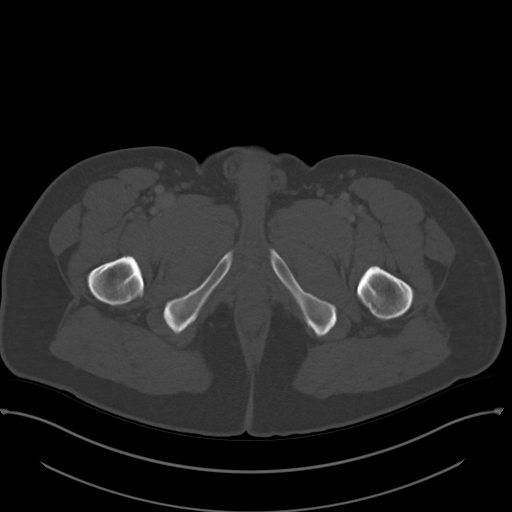
[im 15/105  soft-tissue]
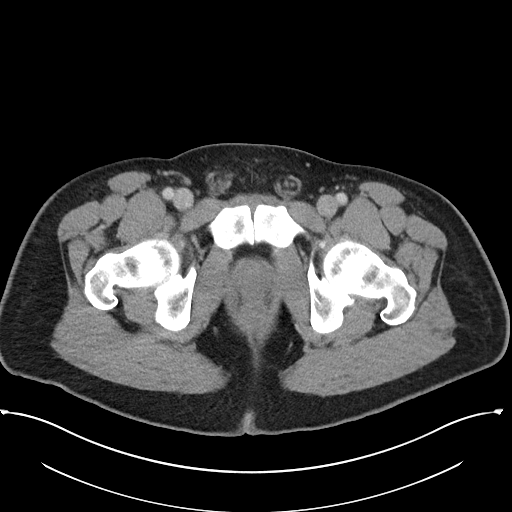
[im 23/105  soft-tissue]
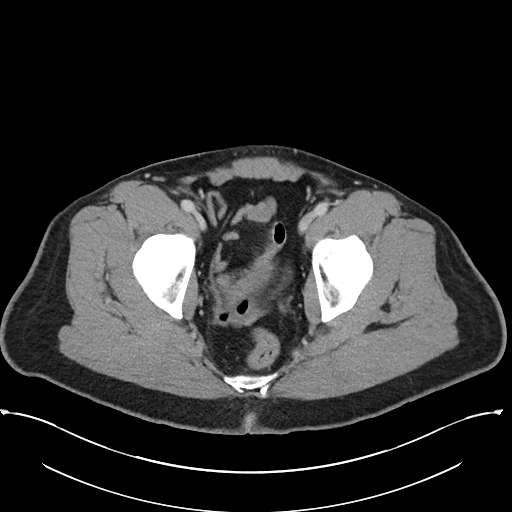
[im 30/105  soft-tissue]
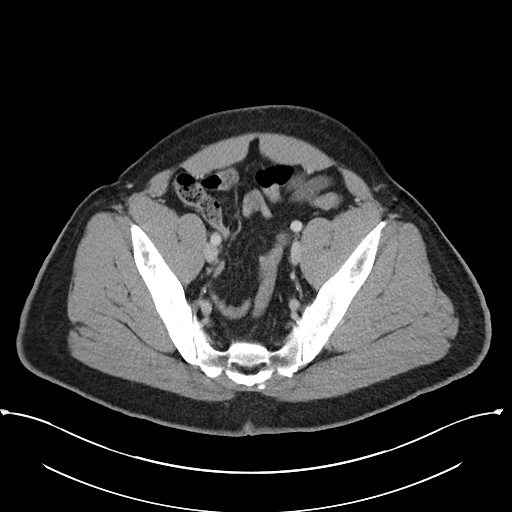
[im 38/105  soft-tissue]
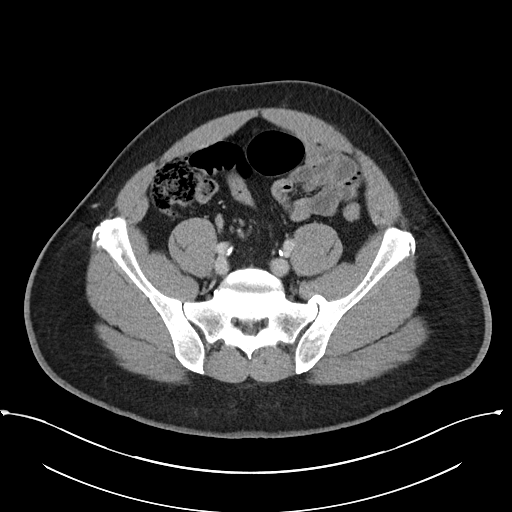
[im 45/105  soft-tissue]
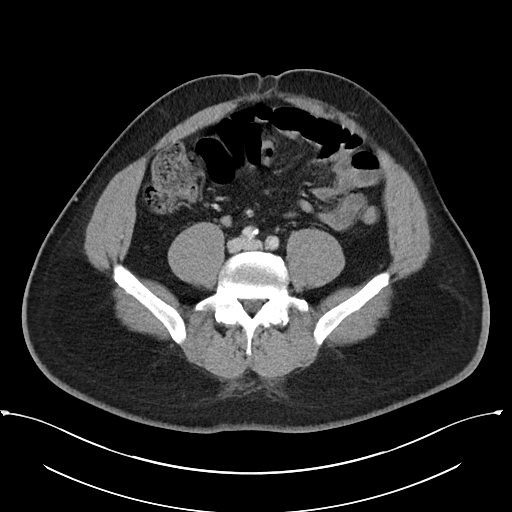
[im 60/105  soft-tissue]
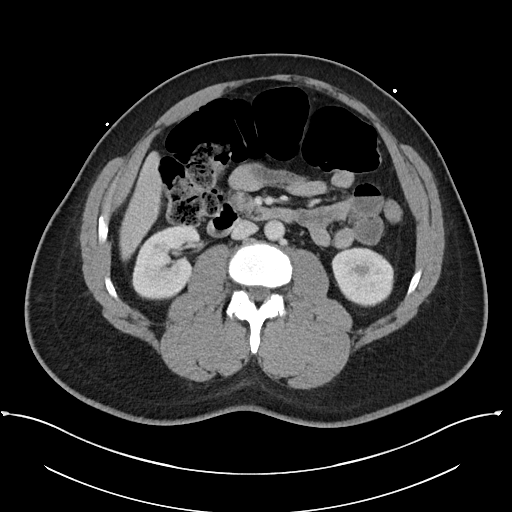
[im 67/105  soft-tissue]
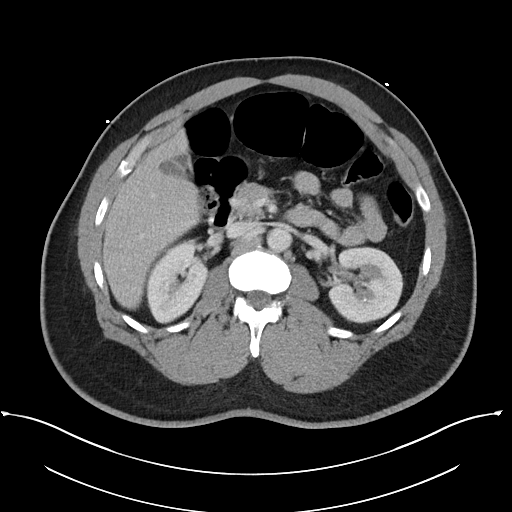
[im 75/105  soft-tissue]
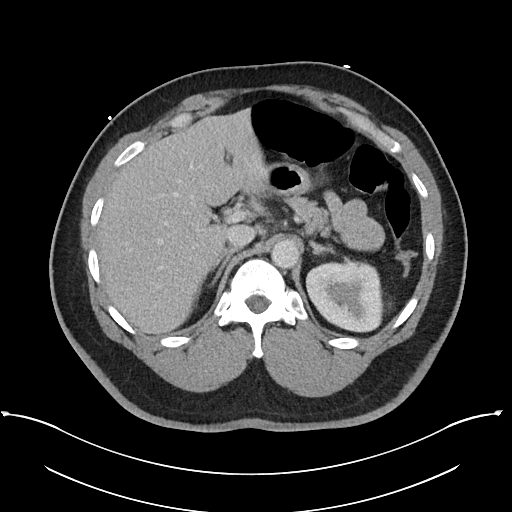
[im 75/105  bone]
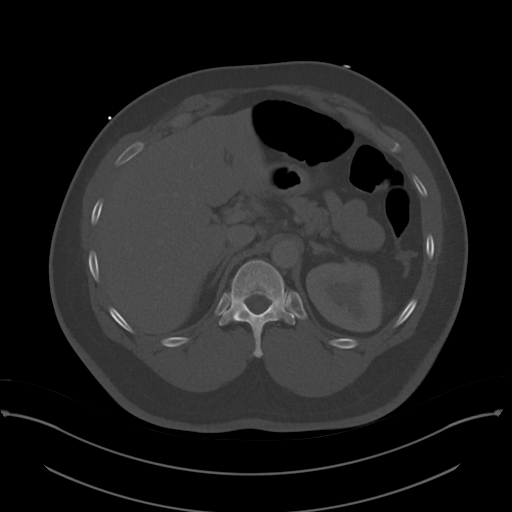
[im 82/105  soft-tissue]
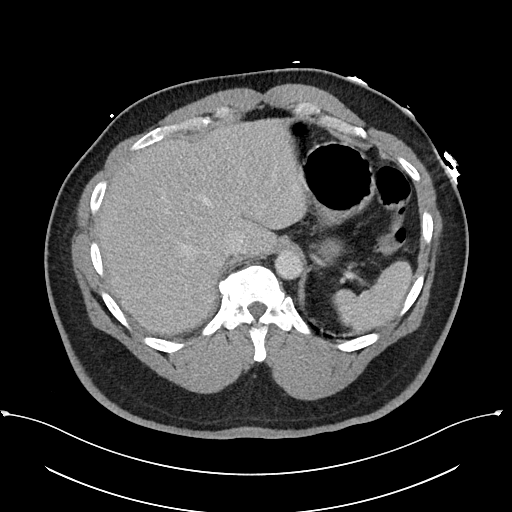
[im 90/105  soft-tissue]
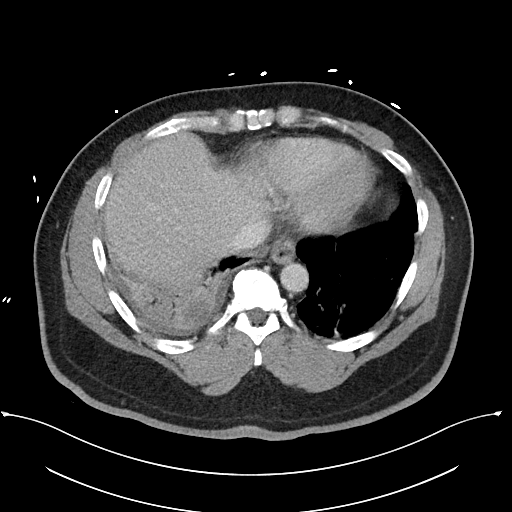
[im 97/105  soft-tissue]
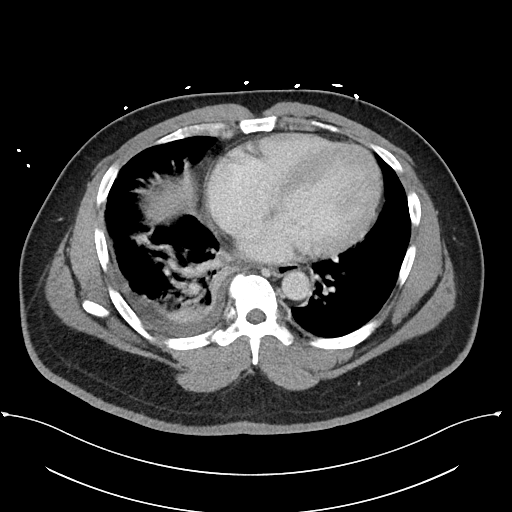

[Series 7: abdomen 3.0 mpr cor · coronal · 0.88mm/px · 3 of 117 slices shown]
[im 39/117  soft-tissue]
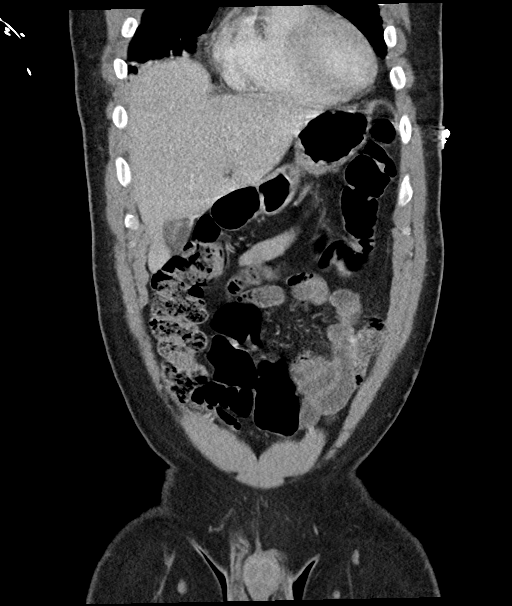
[im 52/117  soft-tissue]
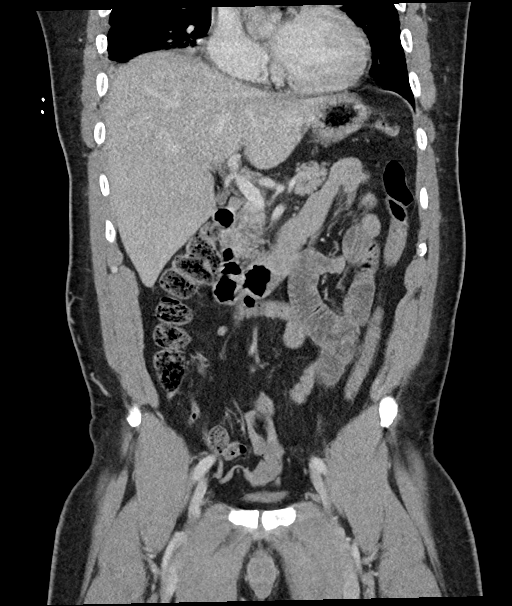
[im 65/117  soft-tissue]
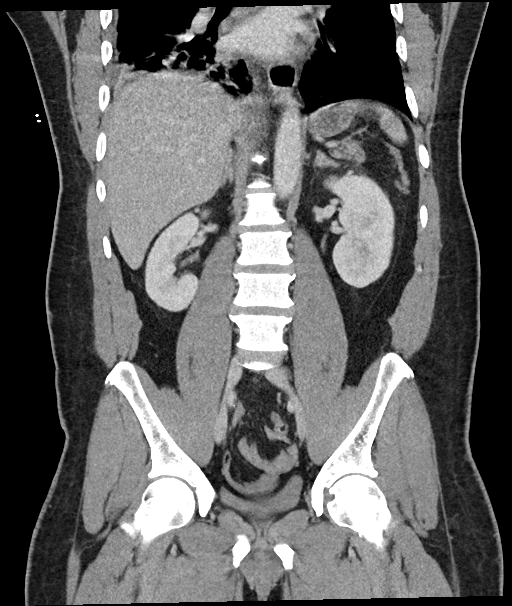

[15 of 46 positions shown; findings below may reference images not displayed]

FINDINGS: Lower chest: Small right pleural effusion that is partially
loculated, with fluid tracking into the minor fissure. Heterogeneous
right basilar consolidation including at 2.6 cm rounded low-density
component dependently, series 3, image 14. Basilar emphysema
suspected, breathing motion limits detailed assessment. Subsegmental
atelectasis in the left lower lobe. Heart is upper normal in size.

Hepatobiliary: No focal hepatic abnormality or focal lesion.
Gallbladder is decompressed. There is a Phrygian cap. No calcified
gallstone or pericholecystic fat stranding. No biliary dilatation.

Pancreas: No ductal dilatation or inflammation. No evidence of
pancreatic mass.

Spleen: Normal in size without focal abnormality.

Adrenals/Urinary Tract: Diffuse left adrenal thickening without
dominant nodule. Normal right adrenal gland. No hydronephrosis or
perinephric edema. There are parapelvic cysts in the upper left
kidney. Tiny right kidney cortical hypodensity medially is too small
to characterize. A least 4 nonobstructing stones in the mid and
lower left kidney. Homogeneous renal enhancement with symmetric
excretion on delayed phase imaging. Urinary bladder is completely
empty and not well assessed.

Stomach/Bowel: Unremarkable stomach. Normal positioning of the
duodenum and ligament of Treitz. Fluid within nondilated small bowel
in the left abdomen without wall thickening or inflammation. No
evidence of obstruction. Normal appendix, for example series 3,
image 69. Moderate stool in the ascending colon. Air-filled
transverse colon. Descending colon is decompressed. Sigmoid colon is
redundant. No colonic wall thickening, inflammation, or evidence of
mass. No significant diverticular disease.

Vascular/Lymphatic: Aortic atherosclerosis. No aortic aneurysm.
Circumaortic left renal vein. There is a 12 mm portal caval nodes
that is nonspecific, series 3, image 35. no pelvic adenopathy.

Reproductive: Prostate is unremarkable.

Other: No free air, free fluid, or intra-abdominal fluid collection.
Small fat containing umbilical hernia.

Musculoskeletal: There are no acute or suspicious osseous
abnormalities. No focal bone lesion.
IMPRESSION: 1. Normal appendix.
2. Small right pleural effusion that is partially loculated.
Heterogeneous right basilar consolidation including at 2.6 cm
rounded low-density component dependently. Differential
considerations include pneumonia or neoplasm with surrounding
atelectasis. Recommend close clinical follow-up and short interval
follow-up CT after a course of treatment. Chest radiograph could be
obtained for baseline.
3. Nonobstructing left renal stones. Parapelvic cysts in the upper
left kidney.
4. Nonspecific 12 mm portal caval lymph node, likely reactive.
5. Possible emphysema in the lung bases, obscured by breathing
motion artifact.

Aortic Atherosclerosis ([id number]-SYB.B) .

## 2022-09-26 IMAGING — CR DG CHEST 2V
2 series · 2 of 2 positions shown · non-contrast
Comparison: None.

CLINICAL DATA: Shortness of breath and abdominal pain.

EXAM:
CHEST - 2 VIEW

[chest lat]
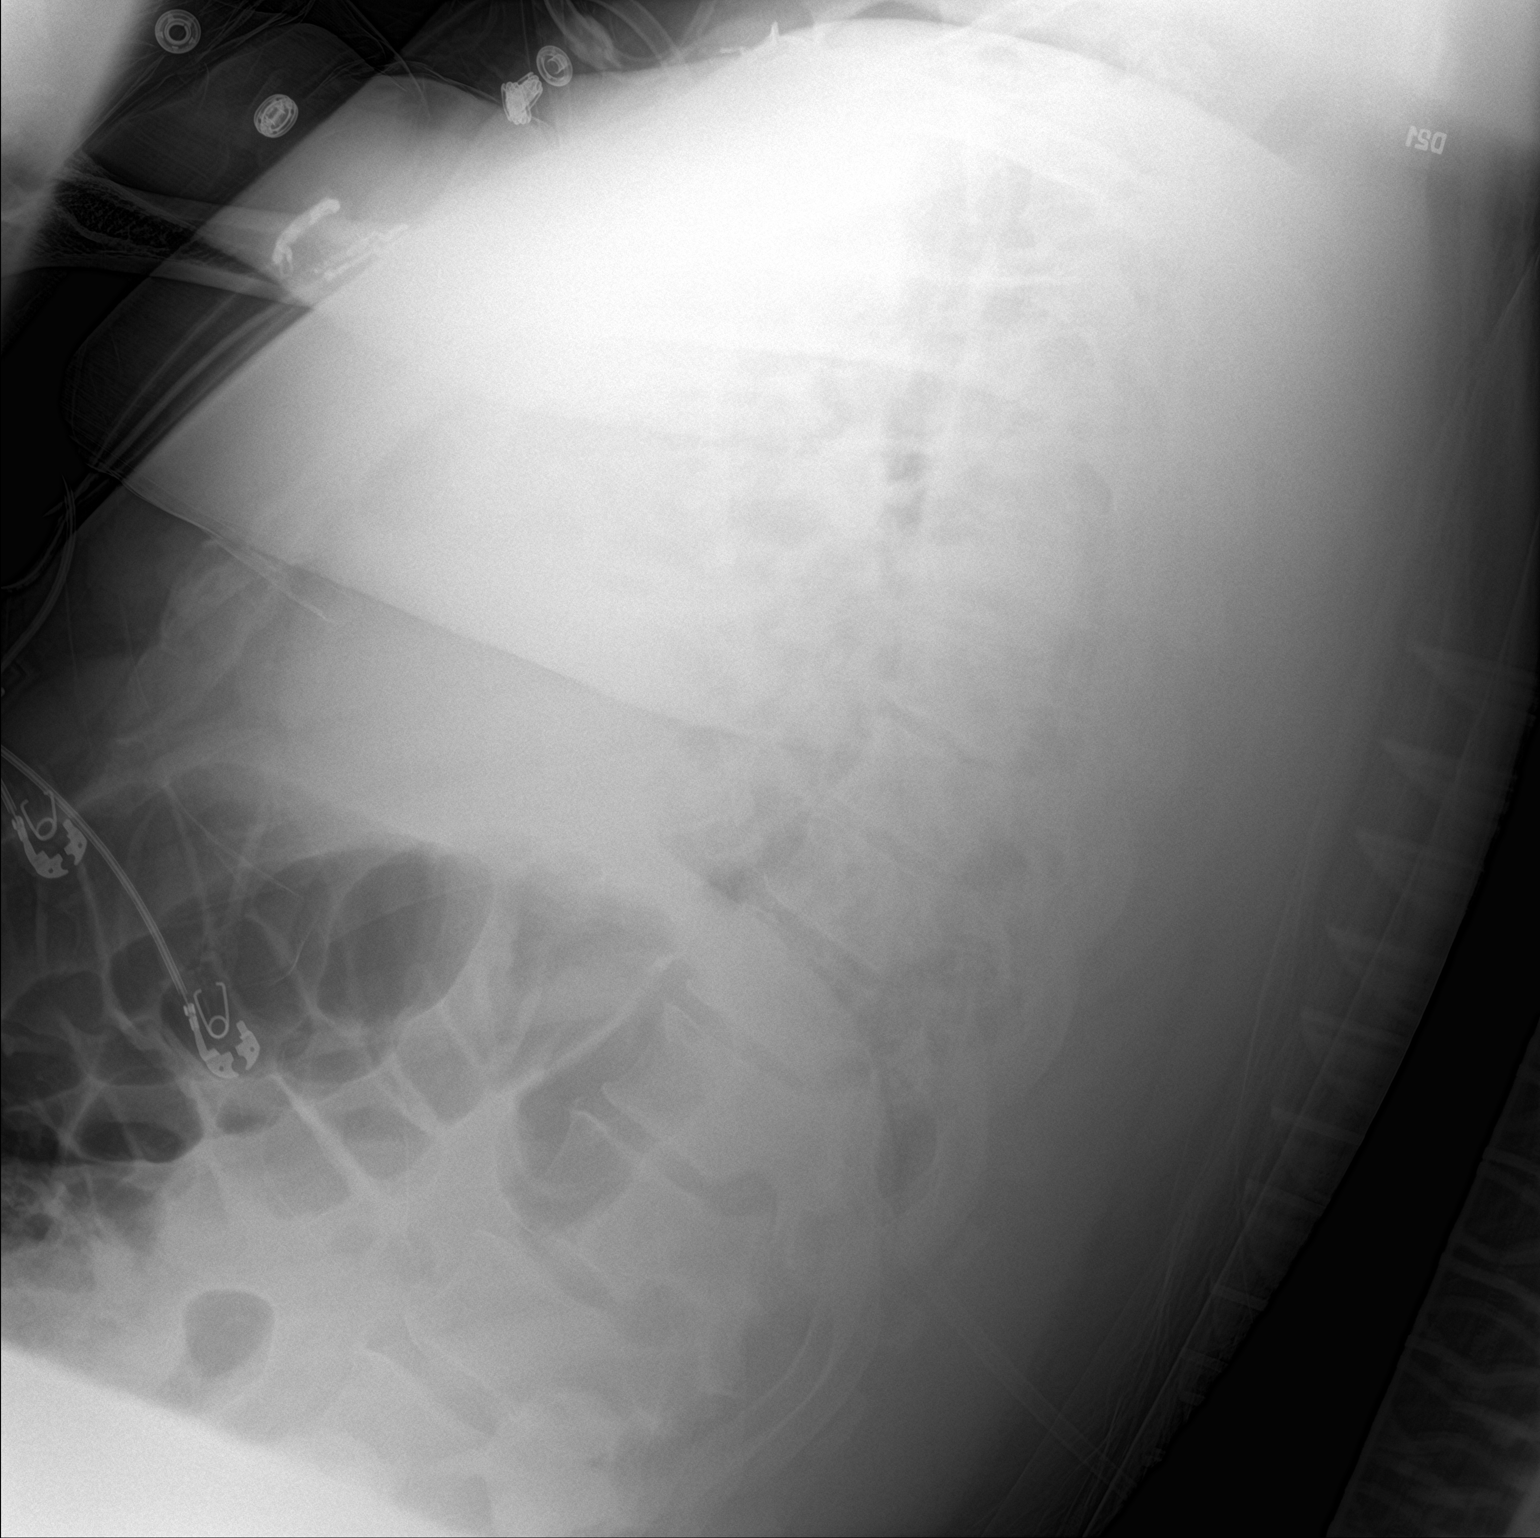

[chest ap]
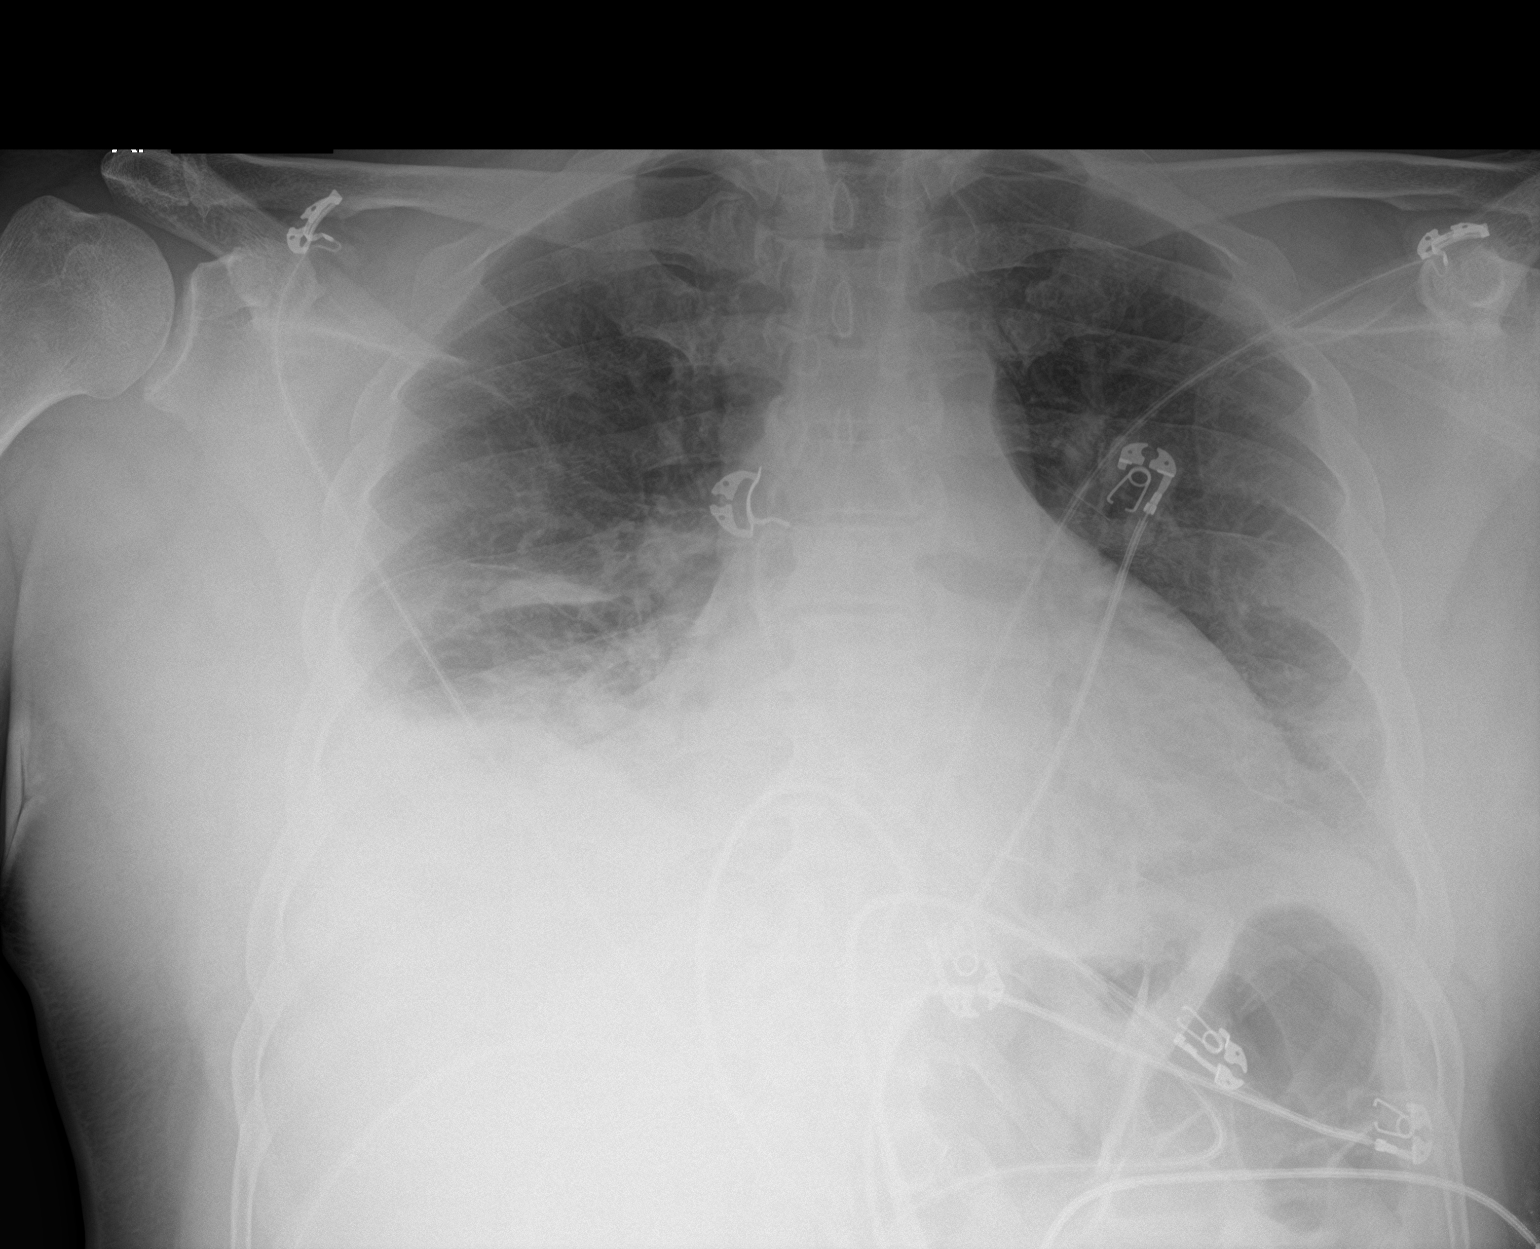

[2 of 2 positions shown; findings below may reference images not displayed]

FINDINGS: 4944 hours. Low volumes. The cardio pericardial silhouette is
enlarged. Right base collapse/consolidation with small right pleural
effusion. Retrocardiac collapse/consolidative opacity noted. The
visualized bony structures of the thorax show no acute abnormality.
Telemetry leads overlie the chest.
IMPRESSION: Low volume film with bibasilar collapse/consolidation and small
right pleural effusion.

## 2023-05-07 ENCOUNTER — Other Ambulatory Visit: Payer: Self-pay | Admitting: Family Medicine

## 2023-05-07 ENCOUNTER — Ambulatory Visit
Admission: RE | Admit: 2023-05-07 | Discharge: 2023-05-07 | Disposition: A | Discharge status: Home / Self Care | Payer: 59 | Source: Ambulatory Visit | Attending: Family Medicine | Admitting: Family Medicine

## 2023-05-07 DIAGNOSIS — M549 Dorsalgia, unspecified: Secondary | ICD-10-CM

## 2024-05-21 ENCOUNTER — Emergency Department (HOSPITAL_BASED_OUTPATIENT_CLINIC_OR_DEPARTMENT_OTHER)

## 2024-05-21 ENCOUNTER — Encounter (HOSPITAL_BASED_OUTPATIENT_CLINIC_OR_DEPARTMENT_OTHER): Payer: Self-pay

## 2024-05-21 ENCOUNTER — Emergency Department (HOSPITAL_BASED_OUTPATIENT_CLINIC_OR_DEPARTMENT_OTHER)
Admission: EM | Admit: 2024-05-21 | Discharge: 2024-05-21 | Disposition: A | Discharge status: Home / Self Care | Attending: Emergency Medicine | Admitting: Emergency Medicine

## 2024-05-21 DIAGNOSIS — Z7982 Long term (current) use of aspirin: Secondary | ICD-10-CM | POA: Insufficient documentation

## 2024-05-21 DIAGNOSIS — M25572 Pain in left ankle and joints of left foot: Secondary | ICD-10-CM | POA: Diagnosis present

## 2024-05-21 DIAGNOSIS — Z79899 Other long term (current) drug therapy: Secondary | ICD-10-CM | POA: Insufficient documentation

## 2024-05-21 DIAGNOSIS — M109 Gout, unspecified: Secondary | ICD-10-CM | POA: Insufficient documentation

## 2024-05-21 DIAGNOSIS — I10 Essential (primary) hypertension: Secondary | ICD-10-CM | POA: Diagnosis not present

## 2024-05-21 MED ORDER — HYDRALAZINE HCL 25 MG PO TABS
25.0000 mg | ORAL_TABLET | Freq: Once | ORAL | Status: AC
  Administered 2024-05-21: 25 mg via ORAL
  Filled 2024-05-21: qty 1

## 2024-05-21 MED ORDER — PREDNISONE 10 MG (21) PO TBPK: ORAL_TABLET | Freq: Every day | ORAL | 0 refills | Status: AC

## 2024-05-21 MED ORDER — KETOROLAC TROMETHAMINE 60 MG/2ML IM SOLN
45.0000 mg | Freq: Once | INTRAMUSCULAR | Status: AC
  Administered 2024-05-21: 45 mg via INTRAMUSCULAR
  Filled 2024-05-21: qty 2

## 2024-05-21 MED ORDER — KETOROLAC TROMETHAMINE 15 MG/ML IJ SOLN
45.0000 mg | Freq: Once | INTRAMUSCULAR | Status: DC
  Filled 2024-05-21: qty 3

## 2024-05-21 MED ORDER — PREDNISONE 50 MG PO TABS
50.0000 mg | ORAL_TABLET | Freq: Once | ORAL | Status: AC
  Administered 2024-05-21: 50 mg via ORAL
  Filled 2024-05-21: qty 1

## 2024-05-21 MED ORDER — VALSARTAN 80 MG PO TABS: 80.0000 mg | ORAL_TABLET | Freq: Every day | ORAL | 1 refills | Status: AC

## 2024-05-21 NOTE — ED Triage Notes (Signed)
 He c/o non-traumatic left ankle pain and swelling x 2-3 days. He cites hx of gout. He capably ambulates with slight limp and is in no distress.

## 2024-05-21 NOTE — ED Provider Notes (Signed)
 " [place] EMERGENCY DEPARTMENT AT [facility] Provider Note   CSN: [id number] Arrival date & time: 05/21/24  9252     Patient presents with: Ankle Pain   Gary Skinner is a 54 y.o. male.  With a history of gout and hypertension who presents to the ED for lower extremity pain.  Patient reports ongoing sharp pain in left foot and left ankle over the last 4 days.  The pain started when he woke up 4 days ago.  No inciting injury or falls.  Feels similar to prior gout flares.  Has been taking prednisone  that was prescribed to someone else which seem to be helping.  No fevers chills.  Is out of his valsartan  at home has not taken this medication.  No refills left after he missed PCP appointment .   Ankle Pain      Prior to Admission medications  Medication Sig Start Date End Date Taking? Authorizing Provider  predniSONE  (STERAPRED UNI-PAK 21 TAB) 10 MG (21) TBPK tablet Take by mouth daily. Take 6 tabs by mouth daily  for 2 days, then 5 tabs for 2 days, then 4 tabs for 2 days, then 3 tabs for 2 days, 2 tabs for 2 days, then 1 tab by mouth daily for 2 days 05/21/24  Yes Pamella Sharper A, DO  valsartan  (DIOVAN ) 80 MG tablet Take 1 tablet (80 mg total) by mouth daily. 05/21/24  Yes Pamella Sharper LABOR, DO  acetaminophen  (TYLENOL ) 325 MG tablet Take 650 mg by mouth every 6 (six) hours as needed for moderate pain or headache.    [provider]  aspirin  EC 81 MG EC tablet Take 1 tablet (81 mg total) by mouth daily. Swallow whole. 07/01/20   Verdene Purchase, MD  diltiazem  (CARDIZEM  CD) 240 MG 24 hr capsule Take 1 capsule (240 mg total) by mouth daily. 07/04/20 10/02/20  Patwardhan, Newman PARAS, MD  guaiFENesin  (MUCINEX ) 600 MG 12 hr tablet Take 1 tablet (600 mg total) by mouth 2 (two) times daily. 06/30/20   Krishnan, Gokul, MD  ibuprofen (ADVIL) 200 MG tablet Take 400 mg by mouth every 6 (six) hours as needed.    [provider]  Tetrahydrozoline HCl (VISINE RED EYE COMFORT OP) Place  1 drop into both eyes daily as needed (red dry eyes).    [provider]    Allergies: Patient has no known allergies.    Review of Systems  Updated Vital Signs BP (!) 213/121 (BP Location: Right Arm)   Pulse 85   Temp 97.6 F (36.4 C) (Oral)   Resp 18   SpO2 100%   Physical Exam Vitals and nursing note reviewed.  HENT:     Head: Normocephalic and atraumatic.  Eyes:     Pupils: Pupils are equal, round, and reactive to light.  Cardiovascular:     Rate and Rhythm: Normal rate and regular rhythm.  Pulmonary:     Effort: Pulmonary effort is normal.     Breath sounds: Normal breath sounds.  Abdominal:     Palpations: Abdomen is soft.     Tenderness: There is no abdominal tenderness.  Musculoskeletal:     Comments: Tenderness over medial aspect of left ankle and proximal dorsal aspect of left foot with no increased warmth erythema bruising No bony tenderness over the foot Full active range of motion left ankle left foot  Skin:    General: Skin is warm and dry.  Neurological:     Mental Status: He is alert.  Psychiatric:        Mood and Affect: Mood normal.     (all labs ordered are listed, but only abnormal results are displayed) Labs Reviewed - No data to display  EKG: None  Radiology: DG Ankle Complete Left Result Date: 05/21/2024 CLINICAL DATA:  Pain. EXAM: LEFT ANKLE COMPLETE - 3+ VIEW COMPARISON:  None Available. FINDINGS: There is no evidence of fracture, dislocation, or joint effusion. There is no evidence of arthropathy or other focal bone abnormality. Soft tissues are unremarkable. IMPRESSION: Negative. Electronically Signed   By: Camellia Candle M.D.   On: 05/21/2024 08:32     Procedures   Medications Ordered in the ED  hydrALAZINE  (APRESOLINE ) tablet 25 mg (has no administration in time range)  ketorolac  (TORADOL ) 15 MG/ML injection 45 mg (has no administration in time range)  predniSONE  (DELTASONE ) tablet 50 mg (has no administration in time  range)                                    Medical Decision Making 54 year old male with history as above presented to the ED given concern for recurrent gout flare in left foot.  No osseous injury or dislocation on left ankle x-ray.  Given that he has a history of gout in the left foot and this is improved with prednisone  and NSAIDs at home will treat for gout flare.  Will give a dose of Toradol  prednisone  here and discharged with prednisone  taper.  Blood pressure significantly elevated here due to medication noncompliance.  Will give a dose of hydralazine .  Will refill valsartan  which he is out of instruct for PCP follow-up.  Return precautions of be worrisome for septic arthritis were discussed in detail.  Amount and/or Complexity of Data Reviewed Radiology: ordered.  Risk Prescription drug management.        Final diagnoses:  Acute gout of left foot, unspecified cause    ED Discharge Orders          Ordered    valsartan  (DIOVAN ) 80 MG tablet  Daily        05/21/24 0840    predniSONE  (STERAPRED UNI-PAK 21 TAB) 10 MG (21) TBPK tablet  Daily        05/21/24 0841               Pamella Ozell LABOR, DO 05/21/24 0902  "

## 2024-05-21 NOTE — Discharge Instructions (Signed)
 You were seen in the emergency department for foot pain The x-ray looked okay This is most likely gout flare Take ibuprofen as directed Pick up the prescribed prednisone  to help with the inflammation and take as directed We have called in a refill for your valsartan  you start taking this as your blood pressure here was very high because you have not been taking your blood pressure medicine Follow-up with your primary doctor in the office Return to the Emergency Department for severe pain, fevers, redness in the foot and ankle or other concerns
